# Patient Record
Sex: Female | Born: 1950 | Race: White | Hispanic: No | Marital: Married | Smoking: Never smoker
Health system: Southern US, Community
[De-identification: ages and names within clinical notes are randomized; demographics above are authoritative.]

## PROBLEM LIST (undated history)

## (undated) DIAGNOSIS — H35039 Hypertensive retinopathy, unspecified eye: Secondary | ICD-10-CM

## (undated) DIAGNOSIS — H269 Unspecified cataract: Secondary | ICD-10-CM

## (undated) DIAGNOSIS — Z973 Presence of spectacles and contact lenses: Secondary | ICD-10-CM

## (undated) DIAGNOSIS — E785 Hyperlipidemia, unspecified: Secondary | ICD-10-CM

## (undated) DIAGNOSIS — K219 Gastro-esophageal reflux disease without esophagitis: Secondary | ICD-10-CM

## (undated) DIAGNOSIS — M1990 Unspecified osteoarthritis, unspecified site: Secondary | ICD-10-CM

## (undated) DIAGNOSIS — H3320 Serous retinal detachment, unspecified eye: Secondary | ICD-10-CM

## (undated) DIAGNOSIS — R519 Headache, unspecified: Secondary | ICD-10-CM

## (undated) DIAGNOSIS — I10 Essential (primary) hypertension: Secondary | ICD-10-CM

## (undated) DIAGNOSIS — I639 Cerebral infarction, unspecified: Secondary | ICD-10-CM

## (undated) DIAGNOSIS — T7840XA Allergy, unspecified, initial encounter: Secondary | ICD-10-CM

## (undated) HISTORY — DX: Hyperlipidemia, unspecified: E78.5

## (undated) HISTORY — DX: Unspecified osteoarthritis, unspecified site: M19.90

## (undated) HISTORY — PX: CATARACT EXTRACTION: SUR2

## (undated) HISTORY — DX: Hypertensive retinopathy, unspecified eye: H35.039

## (undated) HISTORY — PX: LIPOMA RESECTION: SHX23

## (undated) HISTORY — PX: JOINT REPLACEMENT: SHX530

## (undated) HISTORY — PX: COLONOSCOPY: SHX174

## (undated) HISTORY — DX: Gastro-esophageal reflux disease without esophagitis: K21.9

## (undated) HISTORY — PX: EYE SURGERY: SHX253

---
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
## 2004-09-04 ENCOUNTER — Ambulatory Visit: Payer: Self-pay | Admitting: Surgery

## 2007-04-20 ENCOUNTER — Emergency Department: Payer: Self-pay | Admitting: Emergency Medicine

## 2007-07-20 ENCOUNTER — Ambulatory Visit: Payer: Self-pay | Admitting: Internal Medicine

## 2008-09-09 HISTORY — PX: REPLACEMENT TOTAL KNEE: SUR1224

## 2009-02-20 ENCOUNTER — Ambulatory Visit: Payer: Self-pay | Admitting: General Practice

## 2009-03-01 ENCOUNTER — Inpatient Hospital Stay: Payer: Self-pay | Admitting: General Practice

## 2011-05-14 ENCOUNTER — Ambulatory Visit (INDEPENDENT_AMBULATORY_CARE_PROVIDER_SITE_OTHER): Payer: BC Managed Care – PPO | Admitting: Internal Medicine

## 2011-05-14 ENCOUNTER — Encounter: Payer: Self-pay | Admitting: Internal Medicine

## 2011-05-14 DIAGNOSIS — E119 Type 2 diabetes mellitus without complications: Secondary | ICD-10-CM | POA: Insufficient documentation

## 2011-05-14 DIAGNOSIS — I10 Essential (primary) hypertension: Secondary | ICD-10-CM

## 2011-05-14 DIAGNOSIS — R32 Unspecified urinary incontinence: Secondary | ICD-10-CM | POA: Insufficient documentation

## 2011-05-14 DIAGNOSIS — Z0000 Encounter for general adult medical examination without abnormal findings: Secondary | ICD-10-CM

## 2011-05-14 DIAGNOSIS — E785 Hyperlipidemia, unspecified: Secondary | ICD-10-CM

## 2011-05-14 NOTE — Patient Instructions (Signed)
Follow up with Dr. Yates Decamp as scheduled in November 2012. Return to clinic here in 1 year.

## 2011-05-14 NOTE — Progress Notes (Signed)
Subjective:    Patient ID: Mariah Cortez, female    DOB: 09-Nov-1950, 60 y.o.   MRN: [id number]  HPI [name] is a 60 year old female who presents for female exam. She reports that her primary care physician, Dr. Yates Decamp, currently manages her diabetes, hypertension, and hyperlipidemia. She presents only for female physical exam. Her only complaint today is chronic urinary incontinence. She questions whether or not she may be a candidate for surgical repair of her bladder. She has not tried medications for urge incontinence. She reports both chronic dribbling of urine and symptoms of urge incontinence. She wears a pad on a daily basis. She denies any dysuria, hematuria, flank pain, fever. She denies any other complaints.  Outpatient Encounter Prescriptions as of 05/14/2011  Medication Sig Dispense Refill  . Ascorbic Acid (VITAMIN C CR) 1500 MG TBCR Take 1 tablet by mouth daily.        [name] aspirin 81 MG tablet Take 81 mg by mouth daily.        . Calcium Carbonate-Vitamin D (CALCIUM 600 + D PO) Take 1 capsule by mouth 2 (two) times daily.        . cetirizine (ZYRTEC) 10 MG tablet Take 10 mg by mouth daily.        . enalapril (VASOTEC) 20 MG tablet Take 20 mg by mouth daily.        . Ferrous Sulfate (IRON) 325 (65 FE) MG TABS Take 1 capsule by mouth daily.        [name] HYDROCHLOROTHIAZIDE PO Take 1 tablet by mouth daily.        [name] lovastatin (MEVACOR) 40 MG tablet Take 40 mg by mouth 2 (two) times daily.        . Misc Natural Products (OSTEO BI-FLEX JOINT SHIELD PO) Take 1 tablet by mouth daily.        . Multiple Vitamin (MULTIVITAMIN) capsule Take 1 capsule by mouth daily.        . Omega-3 Fatty Acids (FISH OIL) 1000 MG CAPS Take 1 capsule by mouth daily.        [name] omeprazole (PRILOSEC) 20 MG capsule Take 20 mg by mouth daily.        . traMADol (ULTRAM) 50 MG tablet Take 50 mg by mouth 2 (two) times daily.          Review of Systems  Constitutional: Negative for fever, chills, appetite change, fatigue  and unexpected weight change.  HENT: Negative for ear pain, congestion, sore throat, trouble swallowing, neck pain, voice change and sinus pressure.   Eyes: Negative for visual disturbance.  Respiratory: Negative for cough, shortness of breath, wheezing and stridor.   Cardiovascular: Negative for chest pain, palpitations and leg swelling.  Gastrointestinal: Negative for nausea, vomiting, abdominal pain, diarrhea, constipation, blood in stool, abdominal distention and anal bleeding.  Genitourinary: Negative for dysuria, urgency, frequency, hematuria, flank pain, decreased urine volume and difficulty urinating.  Musculoskeletal: Negative for myalgias, arthralgias and gait problem.  Skin: Negative for color change, rash and wound.  Neurological: Negative for dizziness and headaches.  Hematological: Negative for adenopathy. Does not bruise/bleed easily.  Psychiatric/Behavioral: Negative for dysphoric mood. The patient is not nervous/anxious.        Objective:   Physical Exam  Constitutional: She is oriented to person, place, and time. She appears well-developed and well-nourished. No distress.  HENT:  Head: Normocephalic and atraumatic.  Right Ear: Hearing, tympanic membrane, external ear and ear canal normal.  Left Ear: Hearing, tympanic membrane,  external ear and ear canal normal.  Nose: Nose normal.  Mouth/Throat: Oropharynx is clear and moist. No oropharyngeal exudate.  Eyes: Conjunctivae and EOM are normal. Pupils are equal, round, and reactive to light. Right eye exhibits no discharge. Left eye exhibits no discharge. No scleral icterus.  Neck: Normal range of motion. Neck supple. Carotid bruit is not present. No tracheal deviation present. No thyromegaly present.  Cardiovascular: Normal rate, regular rhythm, normal heart sounds and intact distal pulses.  Exam reveals no gallop and no friction rub.   No murmur heard. Pulmonary/Chest: Effort normal and breath sounds normal. No respiratory  distress. She has no wheezes. She has no rales. She exhibits no tenderness.  Abdominal: Soft. Bowel sounds are normal. She exhibits no distension and no mass. There is no hepatosplenomegaly. There is no tenderness. There is no rebound and no guarding.  Genitourinary: Rectum normal and uterus normal. No breast swelling, tenderness, discharge or bleeding. Pelvic exam was performed with patient prone. There is no rash, tenderness or lesion on the right labia. There is no rash, tenderness or lesion on the left labia. Cervix exhibits no motion tenderness, no discharge and no friability. Right adnexum displays no mass, no tenderness and no fullness. Left adnexum displays no mass, no tenderness and no fullness. There is erythema around the vagina.  Musculoskeletal: Normal range of motion. She exhibits no edema and no tenderness.  Lymphadenopathy:    She has no cervical adenopathy.  Neurological: She is alert and oriented to person, place, and time. No cranial nerve deficit. She exhibits normal muscle tone. Coordination normal.  Skin: Skin is warm and dry. No rash noted. She is not diaphoretic. No erythema. No pallor.  Psychiatric: She has a normal mood and affect. Her behavior is normal. Judgment and thought content normal.          Assessment & Plan:  [name] is a 60 year old female who presents for a general physical exam. Her exam today was normal with the exception of atrophic vaginitis noted. Her breast exam was normal. She reports that her primary care physician Dr. Yates Decamp has set her up for yearly mammogram. We will request records on this. We discussed that if her Pap is normal today she will need next Pap smear in 3 years. We will plan to see her back in our clinic for annual physical exam in one year. Per her preference, she will continue to follow with Dr. Yates Decamp for management of her diabetes, hypertension, hyperlipidemia. In regards to her chronic urinary incontinence, I recommended  referral to urogynecology for evaluation and/or a trial of medication such as to BS. She would like to defer referral or  trying medication at this time. We also discussed that she is due for both TDAP and Zostavax vaccines. We don't have the TDAP vaccine today, and she may return to clinic next week for this. She will go to her pharmacy for the Zostavax vaccine.

## 2011-05-15 ENCOUNTER — Other Ambulatory Visit (HOSPITAL_COMMUNITY)
Admission: RE | Admit: 2011-05-15 | Discharge: 2011-05-15 | Disposition: A | Discharge status: Home / Self Care | Payer: BC Managed Care – PPO | Source: Ambulatory Visit | Attending: Internal Medicine | Admitting: Internal Medicine

## 2011-05-15 DIAGNOSIS — Z01419 Encounter for gynecological examination (general) (routine) without abnormal findings: Secondary | ICD-10-CM | POA: Insufficient documentation

## 2011-05-15 NOTE — Progress Notes (Signed)
Addended by: Melody Comas L on: 05/15/2011 11:01 AM   Modules accepted: Orders

## 2011-05-30 ENCOUNTER — Telehealth: Payer: Self-pay | Admitting: Internal Medicine

## 2011-09-10 HISTORY — PX: REPLACEMENT TOTAL KNEE: SUR1224

## 2011-12-24 ENCOUNTER — Ambulatory Visit: Payer: Self-pay | Admitting: General Practice

## 2011-12-24 LAB — MRSA PCR SCREENING

## 2011-12-24 LAB — SEDIMENTATION RATE: Erythrocyte Sed Rate: 6 mm/hr (ref 0–30)

## 2011-12-24 LAB — PROTIME-INR: INR: 0.9

## 2011-12-24 LAB — URINALYSIS, COMPLETE
Bacteria: NONE SEEN
Bilirubin,UR: NEGATIVE
Blood: NEGATIVE
Ketone: NEGATIVE
Nitrite: NEGATIVE
Ph: 6 (ref 4.5–8.0)
Protein: NEGATIVE
RBC,UR: <1 /HPF (ref 0–5)
Specific Gravity: 1.01 (ref 1.003–1.030)
Squamous Epithelial: <1
WBC UR: <1 /HPF (ref 0–5)

## 2011-12-24 LAB — CBC
HCT: 38 % (ref 35.0–47.0)
MCV: 91 fL (ref 80–100)
RBC: 4.16 10*6/uL (ref 3.80–5.20)

## 2012-01-06 ENCOUNTER — Inpatient Hospital Stay: Payer: Self-pay | Admitting: General Practice

## 2012-01-07 LAB — PLATELET COUNT: Platelet: 133 10*3/uL — ABNORMAL LOW (ref 150–440)

## 2012-01-07 LAB — HEMOGLOBIN: HGB: 11.4 g/dL — ABNORMAL LOW (ref 12.0–16.0)

## 2012-01-07 LAB — BASIC METABOLIC PANEL
Anion Gap: 11 (ref 7–16)
BUN: 9 mg/dL (ref 7–18)
Calcium, Total: 8.1 mg/dL — ABNORMAL LOW (ref 8.5–10.1)
EGFR (African American): >60
EGFR (Non-African Amer.): >60
Sodium: 138 mmol/L (ref 136–145)

## 2012-01-08 LAB — BASIC METABOLIC PANEL
Anion Gap: 7 (ref 7–16)
BUN: 7 mg/dL (ref 7–18)
Chloride: 103 mmol/L (ref 98–107)
Co2: 31 mmol/L (ref 21–32)
Creatinine: 0.57 mg/dL — ABNORMAL LOW (ref 0.60–1.30)
EGFR (African American): >60
EGFR (Non-African Amer.): >60
Osmolality: 283 (ref 275–301)
Potassium: 3.7 mmol/L (ref 3.5–5.1)
Sodium: 141 mmol/L (ref 136–145)

## 2012-01-08 LAB — PLATELET COUNT: Platelet: 129 10*3/uL — ABNORMAL LOW (ref 150–440)

## 2012-01-10 ENCOUNTER — Encounter: Payer: Self-pay | Admitting: Internal Medicine

## 2012-04-14 NOTE — Telephone Encounter (Signed)
DONE

## 2012-07-28 ENCOUNTER — Other Ambulatory Visit (HOSPITAL_COMMUNITY)
Admission: RE | Admit: 2012-07-28 | Discharge: 2012-07-28 | Disposition: A | Discharge status: Home / Self Care | Payer: BC Managed Care – PPO | Source: Ambulatory Visit | Attending: Internal Medicine | Admitting: Internal Medicine

## 2012-07-28 ENCOUNTER — Encounter: Payer: Self-pay | Admitting: Internal Medicine

## 2012-07-28 ENCOUNTER — Ambulatory Visit (INDEPENDENT_AMBULATORY_CARE_PROVIDER_SITE_OTHER): Payer: BC Managed Care – PPO | Admitting: Internal Medicine

## 2012-07-28 VITALS — BP 142/86 | HR 86 | Temp 98.8°F | Ht <= 58 in | Wt 237.0 lb

## 2012-07-28 DIAGNOSIS — E119 Type 2 diabetes mellitus without complications: Secondary | ICD-10-CM

## 2012-07-28 DIAGNOSIS — B373 Candidiasis of vulva and vagina: Secondary | ICD-10-CM

## 2012-07-28 DIAGNOSIS — Z0000 Encounter for general adult medical examination without abnormal findings: Secondary | ICD-10-CM | POA: Insufficient documentation

## 2012-07-28 DIAGNOSIS — Z1151 Encounter for screening for human papillomavirus (HPV): Secondary | ICD-10-CM | POA: Insufficient documentation

## 2012-07-28 DIAGNOSIS — Z01419 Encounter for gynecological examination (general) (routine) without abnormal findings: Secondary | ICD-10-CM | POA: Insufficient documentation

## 2012-07-28 DIAGNOSIS — I10 Essential (primary) hypertension: Secondary | ICD-10-CM

## 2012-07-28 MED ORDER — NYSTATIN 100000 UNIT/GM EX POWD: Freq: Two times a day (BID) | CUTANEOUS | Status: DC

## 2012-07-28 NOTE — Assessment & Plan Note (Signed)
Blood pressure slightly elevated today. Patient reports good control of blood pressure at home. We'll continue current medications.

## 2012-07-28 NOTE — Assessment & Plan Note (Signed)
General medical exam including pelvic exam normal today except as noted. Pap is pending. Vaginal atrophy noted on exam. We discussed that if this Pap is normal and HPV negative she likely does not need repeat Pap. We discussed the Pap does not provide screening for ovarian cancer, only cervical cancer. She will continue to follow with Dr. Yates Decamp at [facility] for routine care. Follow up here in 1 year for GYN exam.

## 2012-07-28 NOTE — Progress Notes (Signed)
Subjective:    Patient ID: Mariah Cortez, female    DOB: 1950-10-22, 61 y.o.   MRN: [id number]  HPI  61 year old female with history of obesity, hypertension, hyperlipidemia, diabetes presents for gynecological exam. She follows with Dr. Yates Decamp at [facility] for her routine care.  She reports that she's been doing well. She reports that blood sugars have been well controlled with last hemoglobin A1c near 7%. She reports full compliance with her medications. Last Pap smear performed in 2012 had inadequate cells for evaluation. She denies any vaginal discharge, pain, bleeding or other concerns today.  Outpatient Encounter Prescriptions as of 07/28/2012  Medication Sig Dispense Refill  . Ascorbic Acid (VITAMIN C CR) 1500 MG TBCR Take 1 tablet by mouth daily.        [name] aspirin 81 MG tablet Take 81 mg by mouth daily.        . Calcium Carbonate-Vitamin D (CALCIUM 600 + D PO) Take 1 capsule by mouth 2 (two) times daily.        . cetirizine (ZYRTEC) 10 MG tablet Take 10 mg by mouth daily.        . enalapril (VASOTEC) 20 MG tablet Take 20 mg by mouth daily.        . Ferrous Sulfate (IRON) 325 (65 FE) MG TABS Take 1 capsule by mouth daily.        [name] HYDROCHLOROTHIAZIDE PO Take 1 tablet by mouth daily.        [name] lovastatin (MEVACOR) 40 MG tablet Take 40 mg by mouth 2 (two) times daily.        . metFORMIN (GLUCOPHAGE) 500 MG tablet Take 500 mg by mouth 1 day or 1 dose.      . Misc Natural Products (OSTEO BI-FLEX JOINT SHIELD PO) Take 1 tablet by mouth daily.        . Multiple Vitamin (MULTIVITAMIN) capsule Take 1 capsule by mouth daily.        . naproxen (NAPROSYN) 500 MG tablet Take 500 mg by mouth 2 (two) times daily with a meal.      . Omega-3 Fatty Acids (FISH OIL) 1000 MG CAPS Take 1 capsule by mouth daily.        [name] omeprazole (PRILOSEC) 20 MG capsule Take 20 mg by mouth daily.        . traMADol (ULTRAM) 50 MG tablet Take 50 mg by mouth 2 (two) times daily.        [name] nystatin (MYCOSTATIN) powder  Apply topically 2 (two) times daily.  15 g  0   BP 142/86  Pulse 86  Temp 98.8 F (37.1 C)  Ht 4\' 10"  (1.473 m)  Wt 237 lb (107.502 kg)  BMI 49.53 kg/m2  Review of Systems  Constitutional: Negative for fever, chills, appetite change, fatigue and unexpected weight change.  HENT: Negative for ear pain, congestion, sore throat, trouble swallowing, neck pain, voice change and sinus pressure.   Eyes: Negative for visual disturbance.  Respiratory: Negative for cough, shortness of breath, wheezing and stridor.   Cardiovascular: Negative for chest pain, palpitations and leg swelling.  Gastrointestinal: Negative for nausea, vomiting, abdominal pain, diarrhea, constipation, blood in stool, abdominal distention and anal bleeding.  Genitourinary: Negative for dysuria, urgency, hematuria, flank pain, vaginal bleeding, vaginal discharge, genital sores, vaginal pain and pelvic pain.  Musculoskeletal: Negative for myalgias, arthralgias and gait problem.  Skin: Negative for color change and rash.  Neurological: Negative for dizziness and headaches.  Hematological: Negative for adenopathy.  Does not bruise/bleed easily.  Psychiatric/Behavioral: Negative for suicidal ideas, sleep disturbance and dysphoric mood. The patient is not nervous/anxious.        Objective:   Physical Exam  Constitutional: She is oriented to person, place, and time. She appears well-developed and well-nourished. No distress.  HENT:  Head: Normocephalic and atraumatic.  Right Ear: External ear normal.  Left Ear: External ear normal.  Nose: Nose normal.  Mouth/Throat: Oropharynx is clear and moist. No oropharyngeal exudate.  Eyes: Conjunctivae normal are normal. Pupils are equal, round, and reactive to light. Right eye exhibits no discharge. Left eye exhibits no discharge. No scleral icterus.  Neck: Normal range of motion. Neck supple. No tracheal deviation present. No thyromegaly present.  Cardiovascular: Normal rate, regular  rhythm, normal heart sounds and intact distal pulses.  Exam reveals no gallop and no friction rub.   No murmur heard. Pulmonary/Chest: Effort normal and breath sounds normal. No respiratory distress. She has no wheezes. She has no rales. She exhibits no tenderness.  Abdominal: Soft. Bowel sounds are normal. She exhibits no distension and no mass. There is no tenderness. There is no rebound and no guarding.  Genitourinary: Uterus normal. Cervix exhibits friability. Cervix exhibits no motion tenderness and no discharge. Right adnexum displays no mass, no tenderness and no fullness. Left adnexum displays no mass, no tenderness and no fullness. There is erythema (consistent with atrophy) around the vagina.  Musculoskeletal: Normal range of motion. She exhibits no edema and no tenderness.  Lymphadenopathy:    She has no cervical adenopathy.  Neurological: She is alert and oriented to person, place, and time. No cranial nerve deficit. She exhibits normal muscle tone. Coordination normal.  Skin: Skin is warm and dry. No rash noted. She is not diaphoretic. There is erythema (pannus folds). No pallor.  Psychiatric: She has a normal mood and affect. Her behavior is normal. Judgment and thought content normal.          Assessment & Plan:

## 2012-07-28 NOTE — Assessment & Plan Note (Signed)
Patient reports good control of blood sugars with last hemoglobin A1c near 7. She will continue to follow with her primary care physician, Dr. Yates Decamp at [facility] q3 months.

## 2012-07-28 NOTE — Assessment & Plan Note (Addendum)
Erythematous rash consistent with candidiasis noted in pannus folds and throughout groin. Will treat with topical nystatin powder. Followup as needed.

## 2012-07-29 ENCOUNTER — Encounter: Payer: Self-pay | Admitting: *Deleted

## 2012-07-29 ENCOUNTER — Telehealth: Payer: Self-pay | Admitting: Internal Medicine

## 2012-07-29 NOTE — Telephone Encounter (Signed)
Error

## 2013-01-15 ENCOUNTER — Ambulatory Visit: Payer: Self-pay | Admitting: Unknown Physician Specialty

## 2013-04-06 ENCOUNTER — Telehealth: Payer: Self-pay | Admitting: Internal Medicine

## 2013-04-06 NOTE — Telephone Encounter (Signed)
This pt has previously followed with [place] for yearly exams and with Yates Decamp for Diabetes Management. Because of new insurance requirements, she will need to choose one primary care provider. If Dr. Yates Decamp is managing her diabetes, and she will continue with him, then she should follow with him and we should remove me as her PCP.

## 2013-04-07 NOTE — Telephone Encounter (Signed)
Left message on home answer machine for pt to call office

## 2013-04-08 ENCOUNTER — Telehealth: Payer: Self-pay | Admitting: Adult Health

## 2013-04-08 NOTE — Telephone Encounter (Signed)
Opened in erorr

## 2013-04-09 NOTE — Telephone Encounter (Signed)
Spoke with pt she is going to see dr Yates Decamp next week and she will discuss this with him and call back to let us know if she is going with dr Jonny Ruiz walker or with you.  She know she would have to come in every 3 months to follow up for her diabetes

## 2013-04-21 NOTE — Telephone Encounter (Signed)
Patient called and left a message on my voicemail stating she was going to continue to see Dr. Yates Decamp as her PCP. She only came here because she thought he did not pap smears but he does so she will keep seeing him at her doctor.

## 2013-04-22 ENCOUNTER — Telehealth: Payer: Self-pay | Admitting: Internal Medicine

## 2013-04-22 NOTE — Telephone Encounter (Signed)
Pt called back stating she spoke with dr Jonny Ruiz walker, he was telling her there were 2 types of cpx.  One was routine and one was female physcial.  She wanted to confirm this.  Also pt wanted to know if she know went with dr Yates Decamp and she isn't happy can she come back to you.  Also she wanted to know at 49 how often does she need to get a pap

## 2013-04-22 NOTE — Telephone Encounter (Signed)
Fwd to Dr. Ronna Polio

## 2013-04-22 NOTE — Telephone Encounter (Signed)
It is true that there are general physical exams and well-woman exams.   If her PAPs have been normal in the past, she likely does not need another PAP smear. Guidelines say to stop PAPs at age 64, unless ongoing issues.

## 2013-04-23 NOTE — Telephone Encounter (Signed)
Left message to call back  

## 2013-04-26 NOTE — Telephone Encounter (Signed)
FYI to Dr. Walker 

## 2013-04-26 NOTE — Telephone Encounter (Signed)
OK. Please take me off as PCP

## 2013-04-26 NOTE — Telephone Encounter (Signed)
Patient returned our call and she is going to go with Dr. Yates Decamp since he is closer to her home.  She said that there is nothing personal in her leaving our practice she just knows that he is closer to her home.

## 2014-01-28 DIAGNOSIS — Z96659 Presence of unspecified artificial knee joint: Secondary | ICD-10-CM | POA: Insufficient documentation

## 2014-01-28 DIAGNOSIS — I10 Essential (primary) hypertension: Secondary | ICD-10-CM | POA: Insufficient documentation

## 2014-11-08 DIAGNOSIS — M19041 Primary osteoarthritis, right hand: Secondary | ICD-10-CM | POA: Insufficient documentation

## 2014-12-23 DIAGNOSIS — M19011 Primary osteoarthritis, right shoulder: Secondary | ICD-10-CM | POA: Insufficient documentation

## 2014-12-23 DIAGNOSIS — E1129 Type 2 diabetes mellitus with other diabetic kidney complication: Secondary | ICD-10-CM | POA: Insufficient documentation

## 2014-12-23 DIAGNOSIS — E782 Mixed hyperlipidemia: Secondary | ICD-10-CM | POA: Insufficient documentation

## 2015-01-01 NOTE — Op Note (Signed)
PATIENT NAME:  Mariah Cortez, [name] MR#:  [id number] DATE OF BIRTH:  1951/03/02  DATE OF PROCEDURE:  01/06/2012  PROCEDURE: Left femoral nerve block.   SURGEON: Lurleen Soltero P. 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 Francesco SorJames Hooten.   DESCRIPTION OF PROCEDURE: The risks and benefits of the procedure and spinal block and general anesthesia were discussed with the patient preoperatively in same day surgery. 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 She agreed and consent was signed. She was brought to the PAC-U preoperatively. The usual monitors were applied. She was placed on nasal cannula oxygen. Her EKG monitor did show PVCs in singlets which were not observed on her preoperative 12-lead EKG. The patient did state that she has a history of irregular beats like that. It was decided to proceed with the block. She was lightly sedated with a total of 3 mg of Versed intravenously. She was still awake, talkative and in good verbal communication with us but much more comfortable. She had a Betadine prep of her left upper thigh and left groin area x3 and a sterile technique was used. A Stimuplex monitor was used with 0.7 mA of output. The 22-gauge Stimuplex needle was advanced approximately 1 inch lateral to the left femoral pulsation with good left thigh movement on the first pass. There was no heme or paresthesias. She had a total of 30 mL of 0.25% bupivacaine with 1:400,000 of epinephrine injected incrementally. She tolerated the block without problem or complication. I am hopeful that the block will help her significantly with postoperative pain for the duration of the block.   ____________________________ Kirby CriglerScott P. Rubye OaksPalmer, MD spp:cms D: 01/06/2012 09:58:22 ET T: 01/06/2012 10:19:24  ET JOB#: [id number]  cc: Lorin PicketScott P. Rubye OaksPalmer, MD, <Dictator> Heriberto AntiguaSCOTT Nachman Sundt MD ELECTRONICALLY SIGNED 01/06/2012 10:38

## 2015-01-01 NOTE — Op Note (Signed)
PATIENT NAME:  Mariah Cortez, Mariah Cortez MR#:  [id number] DATE OF BIRTH:  07/26/1951  DATE OF PROCEDURE:  01/06/2012  PREOPERATIVE DIAGNOSIS: Degenerative arthrosis of the left knee.   POSTOPERATIVE DIAGNOSIS: Degenerative arthrosis of the left knee.   PROCEDURE PERFORMED: Left total knee arthroplasty using computer-assisted navigation.   SURGEON: Illene LabradorJames P. Angie FavaHooten Jr., MD  ASSISTANT: Van ClinesJon Wolfe, PA-C (required to maintain retraction throughout the procedure)   ANESTHESIA: Femoral nerve block and spinal.   ESTIMATED BLOOD LOSS: 200 mL.   FLUIDS REPLACED: 1600 mL of crystalloid.   TOURNIQUET TIME: 81 minutes.   DRAINS: Two medium drains to reinfusion system.   SOFT TISSUE RELEASES: Anterior cruciate ligament, posterior cruciate ligament, deep medial collateral ligament, patellofemoral ligament.   IMPLANTS UTILIZED: DePuy PFC Sigma size 2 posterior stabilized femoral component (cemented), size 2 MBT tibial component (cemented), 32 mm three peg oval dome patella (cemented), and a 12.5 mm stabilized rotating platform polyethylene insert.   INDICATIONS FOR SURGERY: The patient is a 64 year old female who has been seen for complaints of progressive left knee pain. X-rays demonstrated severe degenerative changes in tricompartmental fashion with varus deformity. After discussion of the risks and benefits of surgical intervention, the patient expressed her understanding of the risks and benefits and agreed with plans for surgical intervention.   PROCEDURE IN DETAIL: Patient was brought into the Operating Room and, after adequate femoral nerve block and spinal anesthesia was achieved, a tourniquet was placed on patient's upper left thigh. Patient's left knee and leg were cleaned and prepped with alcohol and DuraPrep, draped in the usual sterile fashion. A "timeout" was performed as per usual protocol. The left lower extremity was exsanguinated using Esmarch, and the tourniquet was inflated to 300 mmHg. Anterior  longitudinal incision was made followed by a standard mid vastus approach. A moderate effusion was evacuated. The deep fibers of the medial collateral ligament were elevated in subperiosteal fashion off the medial flare of the tibia so as to maintain a continuous soft tissue sleeve. The patella was subluxed laterally and the patellofemoral ligament was incised. Inspection of the knee demonstrated severe degenerative changes in tricompartmental fashion with full thickness loss of articular cartilage. Prominent osteophytes were debrided using a rongeur. Anterior and posterior cruciate ligaments were excised. Two 4.0 mm Schanz pins were inserted into the femur and into the tibia for attachment of the ray of spheres used for computer-assisted navigation. Hip center was identified using circumduction technique. Distal landmarks were mapped using computer. Distal femur and proximal tibia were mapped using computer. Distal femoral cutting guide was positioned using computer-assisted navigation so as to achieve 5 degrees distal valgus cut. Cut was performed and verified using the computer. Distal femur was sized and it was felt that a size 2 femoral component was appropriate. A size 2 cutting guide was positioned using computer-assisted navigation and the anterior cut was performed and verified using computer. This was followed by completion of the posterior and chamfer cuts. Femoral cutting guide for central box was then positioned and central box cut was performed.   Attention was then directed to the proximal tibia. Medial and lateral menisci were excised. The extramedullary tibial cutting guide was positioned using computer-assisted navigation so as to achieve 0 degree varus valgus alignment and 0 degree posterior slope. Cut was performed and verified using the computer. Proximal tibia was sized and it was felt that a size 2 tibial tray was appropriate. Tibial and femoral trials were inserted followed by insertion of  first a 10  and subsequently a 12.5 mm polyethylene trial. This allowed for excellent mediolateral soft tissue balancing both in full extension and in 90 degrees of flexion. Finally, patella was cut and prepared so as to accommodate a 32 mm three peg oval dome patella. Patellar trial was placed and the knee was placed through a range of motion. Excellent patellar tracking appreciated.   Femoral trial was removed. Central post hole for the tibial component was reamed followed by insertion of a keel punch. Tibial trials were then removed. Cut surfaces of bone were irrigated with copious amounts of normal saline with antibiotic solution using pulsatile lavage and then suctioned dry. Polymethyl methacrylate cement with gentamicin was prepared in the usual fashion using a vacuum mixer. Cement was applied to the cut surface of the proximal tibia as well as along the undersurface of a size 2 MBT tibial component. Tibial component was positioned and impacted into place. Excess cement was removed using freer elevators. Cement was then applied to the cut surfaces of the femur as well as along the posterior flanges of a size 2 posterior stabilized femoral component. Femoral component was positioned and impacted into place. Excess cement was removed using freer elevators. A 12.5 mm polyethylene trial was inserted and the knee was brought into full extension with steady axial compression applied. Finally, cement was applied to the backside of a 32 mm three peg oval dome patella and the patella component was positioned and patellar clamp applied. Excess cement was removed using freer elevators.   After adequate curing of the cement, tourniquet was deflated after total tourniquet time of 81 minutes. Hemostasis was achieved using electrocautery. The knee was irrigated with copious amounts of normal saline with antibiotic solution using pulsatile lavage and then suctioned dry. Knee was inspected for any residual cement debris. 30  mL of 0.25% Marcaine with epinephrine was injected along the posterior capsule. A 12.5 mm stabilized rotating platform polyethylene insert was inserted and the knee was placed through a range of motion. Excellent mediolateral soft tissue balancing was appreciated both in full extension and in 90 degrees of flexion. Excellent patellar tracking was appreciated. Two medium drains were placed in the wound bed and brought out through a separate stab incision to be attached to reinfusion system. The medial parapatellar portion of the incision was reapproximated using interrupted sutures of #1 Vicryl. Subcutaneous tissue was approximated in layers using first #0 Vicryl followed by 2-0 Vicryl. Skin was closed with skin staples. Sterile dressing was applied.   Patient tolerated procedure well. She was transported to the recovery room in stable condition.   ____________________________ Illene Labrador. Angie Fava., MD jph:cms D: 01/06/2012 21:25:02 ET T: 01/07/2012 08:20:57 ET JOB#: [id number]  cc: Illene Labrador. Angie Fava., MD, <Dictator> Illene Labrador Angie Fava MD ELECTRONICALLY SIGNED 01/10/2012 18:07

## 2015-01-01 NOTE — Discharge Summary (Signed)
PATIENT NAME:  Mariah Cortez, Mariah Cortez MR#:  [id number] DATE OF BIRTH:  10-14-1950  DATE OF ADMISSION:  01/06/2012 DATE OF DISCHARGE:  01/10/2012  ADMITTING DIAGNOSIS: Degenerative arthrosis of the left knee.   DISCHARGE DIAGNOSIS: Degenerative arthrosis of the left knee.   HISTORY: The patient is a 64 year old who has been followed at [facility] for progression of left knee pain. The patient had reported a several year history of intermittent left knee pain. Approximately seven months ago Mariah Cortez was noted to have significant increase in her left knee pain. The patient had not seen any significant improvement in her condition despite the use of naproxen, cortisone injections, ice, and activity modification. The patient localized most of the pain along the medial aspect as well as the anterior aspect of the knee. Her pain was noted be worse with weight-bearing activities such as standing and walking and stair ambulation. Mariah Cortez had also reported some swelling as well as near giving way of the knee. Mariah Cortez denied any gross locking of the knee. The pain had progressed to the point that it was significantly interfering with her activities of daily living. X-rays taken in the clinic showed narrowing of the medial cartilage space with associated varus alignment. Osteophyte formation as well as subchondral sclerosis was noted. After discussion of the risks and benefits of surgical intervention, the patient expressed her understanding of the risks and benefits and agreed with plans for surgical intervention.   PROCEDURE: Left total knee arthroplasty using computer-assisted navigation.   ANESTHESIA: Femoral nerve block with spinal.   SOFT TISSUE RELEASE: Anterior cruciate ligament, posterior cruciate ligament, deep medial collateral ligaments, as well as the patellofemoral ligament.   IMPLANTS UTILIZED: DePuy PFC Sigma size 2 posterior stabilized femoral component (cemented), size 2 MBT tibial component (cemented), 32 mm  three pegged oval dome patella (cemented), and a 12.5 mm stabilized rotating platform polyethylene insert.   HOSPITAL COURSE: The patient tolerated the procedure very well. Mariah Cortez had no complications. Mariah Cortez was then taken to the PAC-U where Mariah Cortez was stabilized and then transferred to the orthopedic floor. Mariah Cortez began receiving anticoagulation therapy of Lovenox 30 mg subcutaneous every 12 hours per anesthesia protocol. Mariah Cortez was fitted with TED stockings bilaterally. These were allowed to be removed one hour per eight hour shift. The left one was applied on day two after removal of the Hemovac and dressing change. Mariah Cortez was also fitted with the AV-I compression foot pumps bilaterally set at 80 mmHg. The patient's calves have been nontender and free of any evidence of any deep venous thromboses. Negative Homans sign. Her heels were elevated off the bed using rolled towels. Mariah Cortez has voiced no complaints with the heel tenderness.   Mariah Cortez has denied any chest pain or shortness of breath. Vital signs have been stable. Mariah Cortez has been afebrile. Hemodynamically Mariah Cortez was stable and no transfusions were given other than the Autovac transfusion given the first six hours postoperatively.   Physical therapy was initiated on day one for gait training and transfers. Upon being discharged, Mariah Cortez was ambulating greater than 200 feet. Mariah Cortez was able go up and down four sets of steps. Mariah Cortez was not able to do straight leg raises on her own and was very apprehensive about going home because of the remaining weakness. Mariah Cortez was able to dorsiflex and plantarflex the foot. Occupational therapy was also initiated on day one for activities of daily living and assisted devices.   The patient's IV, Foley, and Hemovac were all discontinued on day two along  with a dressing change. The wound was free of any drainage or any signs of infection. Polar Care was reapplied to the surgical leg maintaining a temperature of 40 to 50 degrees Fahrenheit.   DISPOSITION:  The patient is being discharged to a skilled nursing facility in improved stable condition.   DISCHARGE INSTRUCTIONS:  1. Mariah Cortez may weight bear as tolerated.  2. Mariah Cortez is to continue using the knee immobilizer until Mariah Cortez is able to do 10 straight leg raises.  3. Continue with TED stockings. These are allowed to be removed one hour per eight hour shift.  4. Incentive spirometer every 1 hour while awake. Encourage cough and deep breathing every two hours while awake.  5. Polar Care to the surgical leg maintaining a temperature of 40 to 50 degrees Fahrenheit.  6. Physical therapy for gait training and transfers. Occupational therapy for activities of daily living and assistive devices.  7. Mariah Cortez is placed on a regular diet.  8. Mariah Cortez has a follow-up appointment on 01/21/2012 with Van ClinesJon Wolfe, PA and on 02/18/2012 with Dr. Francesco SorJames Hooten.   DRUG ALLERGIES: Morphine and tape.   DISCHARGE MEDICATIONS:  1. Tylenol ES 500 to 1000 mg every four hours p.r.n.  2. Roxicodone 5 to 10 mg every four hours p.r.n. 3. Tramadol 50 to 100 mg every four hours p.r.n.  4. Vasotec 20 mg daily.  5. Hydrochlorothiazide 25 mg daily. 6. Celebrex 200 mg twice a day. 7. Mevacor 80 mg at bedtime.  8. Prilosec 20 mg every 6 a.m.  9. Multivitamin capsule one capsule daily.  10. Omega-3 fatty acids 1 gram capsule daily.  11. Ferrous sulfate 325 mg daily with meal.  12. Ascorbic acid 500 mg daily. 13. Zyrtec 10 mg daily. 14. Metformin 500 mg daily with meal.  15. Insulin sliding scale Novolin R injections.  16. Dulcolax suppositories 10 mg rectally daily p.r.n. 17. Milk of Magnesia 30 mL twice a day p.r.n.  18. Enema soapsuds if no results with milk of magnesia or Dulcolax p.r.n.  19. Mylanta DS 30 mL every 6 hours p.r.n.  20. Senokot-S 1 tablet twice a day.  21. Lovenox 30 mg subcutaneous every 12 hours for 14 days then discontinue and begin taking one 81 mg enteric-coated aspirin per day.   PAST MEDICAL HISTORY:   1. Hypertension.  2. Hyperlipidemia.  3. Seasonal allergies.  4. Diabetes. 5. Acid reflux. 6. Anemia.       7. Migraine headaches. 8. Obesity. ____________________________ Van ClinesJon Wolfe, PA jrw:slb D: 01/10/2012 12:29:58 ET T: 01/10/2012 13:05:29 ET JOB#: [id number]  cc: Van ClinesJon Wolfe, PA, <Dictator> JON WOLFE PA ELECTRONICALLY SIGNED 01/12/2012 19:43

## 2015-02-22 DIAGNOSIS — M8949 Other hypertrophic osteoarthropathy, multiple sites: Secondary | ICD-10-CM | POA: Insufficient documentation

## 2015-02-22 DIAGNOSIS — M159 Polyosteoarthritis, unspecified: Secondary | ICD-10-CM | POA: Insufficient documentation

## 2015-02-22 DIAGNOSIS — M150 Primary generalized (osteo)arthritis: Secondary | ICD-10-CM | POA: Insufficient documentation

## 2015-08-24 ENCOUNTER — Other Ambulatory Visit: Payer: Self-pay | Admitting: Internal Medicine

## 2015-08-24 DIAGNOSIS — Z1239 Encounter for other screening for malignant neoplasm of breast: Secondary | ICD-10-CM

## 2015-09-07 ENCOUNTER — Ambulatory Visit: Payer: BC Managed Care – PPO

## 2015-11-23 DIAGNOSIS — Z6841 Body Mass Index (BMI) 40.0 and over, adult: Secondary | ICD-10-CM | POA: Insufficient documentation

## 2016-02-28 DIAGNOSIS — E7800 Pure hypercholesterolemia, unspecified: Secondary | ICD-10-CM | POA: Insufficient documentation

## 2016-09-10 ENCOUNTER — Other Ambulatory Visit: Payer: Self-pay | Admitting: Internal Medicine

## 2016-09-10 DIAGNOSIS — Z1239 Encounter for other screening for malignant neoplasm of breast: Secondary | ICD-10-CM

## 2016-09-11 DIAGNOSIS — K219 Gastro-esophageal reflux disease without esophagitis: Secondary | ICD-10-CM | POA: Insufficient documentation

## 2017-07-05 DIAGNOSIS — R002 Palpitations: Secondary | ICD-10-CM | POA: Insufficient documentation

## 2017-07-05 DIAGNOSIS — R42 Dizziness and giddiness: Secondary | ICD-10-CM | POA: Insufficient documentation

## 2019-05-06 ENCOUNTER — Other Ambulatory Visit: Payer: Self-pay | Admitting: Physician Assistant

## 2019-05-06 DIAGNOSIS — R519 Headache, unspecified: Secondary | ICD-10-CM

## 2019-05-19 ENCOUNTER — Ambulatory Visit
Admission: RE | Admit: 2019-05-19 | Discharge: 2019-05-19 | Disposition: A | Discharge status: Home / Self Care | Payer: Medicare Other | Source: Ambulatory Visit | Attending: Physician Assistant | Admitting: Physician Assistant

## 2019-05-19 ENCOUNTER — Other Ambulatory Visit: Payer: Self-pay

## 2019-05-19 DIAGNOSIS — R519 Headache, unspecified: Secondary | ICD-10-CM

## 2019-05-19 DIAGNOSIS — R51 Headache: Secondary | ICD-10-CM | POA: Insufficient documentation

## 2019-05-19 MED ORDER — GADOBUTROL 1 MMOL/ML IV SOLN
10.0000 mL | Freq: Once | INTRAVENOUS | Status: AC | PRN
  Administered 2019-05-19: 10 mL via INTRAVENOUS

## 2019-11-22 ENCOUNTER — Encounter (INDEPENDENT_AMBULATORY_CARE_PROVIDER_SITE_OTHER): Payer: Self-pay | Admitting: Ophthalmology

## 2019-11-22 ENCOUNTER — Ambulatory Visit (INDEPENDENT_AMBULATORY_CARE_PROVIDER_SITE_OTHER): Payer: Medicare PPO | Admitting: Ophthalmology

## 2019-11-22 ENCOUNTER — Other Ambulatory Visit (HOSPITAL_COMMUNITY)
Admission: RE | Admit: 2019-11-22 | Discharge: 2019-11-22 | Disposition: A | Discharge status: Home / Self Care | Payer: Medicare PPO | Source: Ambulatory Visit | Attending: Ophthalmology | Admitting: Ophthalmology

## 2019-11-22 DIAGNOSIS — H35033 Hypertensive retinopathy, bilateral: Secondary | ICD-10-CM

## 2019-11-22 DIAGNOSIS — H3581 Retinal edema: Secondary | ICD-10-CM | POA: Diagnosis not present

## 2019-11-22 DIAGNOSIS — Z20822 Contact with and (suspected) exposure to covid-19: Secondary | ICD-10-CM | POA: Diagnosis not present

## 2019-11-22 DIAGNOSIS — H3321 Serous retinal detachment, right eye: Secondary | ICD-10-CM

## 2019-11-22 DIAGNOSIS — E119 Type 2 diabetes mellitus without complications: Secondary | ICD-10-CM | POA: Diagnosis not present

## 2019-11-22 DIAGNOSIS — Z6841 Body Mass Index (BMI) 40.0 and over, adult: Secondary | ICD-10-CM | POA: Diagnosis not present

## 2019-11-22 DIAGNOSIS — M1990 Unspecified osteoarthritis, unspecified site: Secondary | ICD-10-CM | POA: Diagnosis not present

## 2019-11-22 DIAGNOSIS — Z8261 Family history of arthritis: Secondary | ICD-10-CM | POA: Diagnosis not present

## 2019-11-22 DIAGNOSIS — I10 Essential (primary) hypertension: Secondary | ICD-10-CM

## 2019-11-22 DIAGNOSIS — H25813 Combined forms of age-related cataract, bilateral: Secondary | ICD-10-CM

## 2019-11-22 DIAGNOSIS — Z8249 Family history of ischemic heart disease and other diseases of the circulatory system: Secondary | ICD-10-CM | POA: Diagnosis not present

## 2019-11-22 DIAGNOSIS — Z833 Family history of diabetes mellitus: Secondary | ICD-10-CM | POA: Diagnosis not present

## 2019-11-22 DIAGNOSIS — Z885 Allergy status to narcotic agent status: Secondary | ICD-10-CM | POA: Diagnosis not present

## 2019-11-22 DIAGNOSIS — Z96653 Presence of artificial knee joint, bilateral: Secondary | ICD-10-CM | POA: Diagnosis not present

## 2019-11-22 LAB — SARS CORONAVIRUS 2 (TAT 6-24 HRS): SARS Coronavirus 2: NEGATIVE

## 2019-11-22 NOTE — H&P (Signed)
Mariah Cortez is an 69 y.o. female.    Chief Complaint: retinal detachment, right eye  HPI: Pt presents with decreased vision OD of unclear duration. On dilated exam, noted to have a chronic- appearing inferior retinal detachment. After a discussion of the findings, prognosis, and risk, benefits and alternatives to surgery, the patient elected to proceed with surgery for retinal detachment repair, OD, under general anesthesia.  SBP + 25g PPV w/ endolaser and gas OD.  Past Medical History:  Diagnosis Date  . Arthritis   . Diabetes mellitus   . GERD (gastroesophageal reflux disease)   . Hyperlipidemia     Past Surgical History:  Procedure Laterality Date  . LIPOMA RESECTION     Dr. [name]  . REPLACEMENT TOTAL KNEE  2010   Right, Dr. [name]  . REPLACEMENT TOTAL KNEE  2013   left    Family History  Problem Relation Age of Onset  . Stroke Mother   . Hypertension Mother   . Diabetes Sister   . Arthritis Maternal Aunt    Social History:  reports that she has never smoked. She has never used smokeless tobacco. She reports current alcohol use. No history on file for drug.  Allergies:  Allergies  Allergen Reactions  . Morphine And Related Itching    No medications prior to admission.    Review of systems otherwise negative  There were no vitals taken for this visit.  Physical exam: Mental status: oriented x3. Eyes: See eye exam associated with this date of surgery Ears, Nose, Throat: within normal limits Neck: Within Normal limits General: within normal limits Chest: Within normal limits Breast: deferred Heart: Within normal limits Abdomen: Within normal limits GU: deferred Extremities: within normal limits Skin: within normal limits  Assessment/Plan 1. Retinal detachment, RIGHT EYE - chronic, mac/fovea off, inferior detachment  Plan: To [facility] for SBP + 25g PPV w/ endolaser and gas OD under general anesthesia - case scheduled for Thursday, 3.18.21, 1130 am,  [facility] OR 08   Gardiner Sleeper, M.D., Ph.D. Vitreoretinal Surgeon [facility]

## 2019-11-22 NOTE — Progress Notes (Signed)
[facility] Note  11/22/2019     CHIEF COMPLAINT Patient presents for Retina Evaluation   HISTORY OF PRESENT ILLNESS: Mariah Cortez is a 69 y.o. female who presents to the clinic today for:   HPI    Retina Evaluation    In right eye.  Duration of 3 days.  Associated Symptoms Negative for Flashes, Distortion, Pain, Photophobia, Trauma, Jaw Claudication, Fever, Fatigue, Weight Loss, Shoulder/Hip pain, Scalp Tenderness, Blind Spot, Redness, Glare and Floaters.  Context:  distance vision, mid-range vision and near vision.  Treatments tried include no treatments.  I, the attending physician,  performed the HPI with the patient and updated documentation appropriately.          Comments    Patient here for retina evaluation for RD OD mac off. referred by Dr Ellin Mayhew. Patient states vision is terrible. Has cataracts OU. Thought was cataracts, but vision was getting worse. Not sure how long had rd.  Saw Dr and dr saw the RD. No eye pain. Eye watery and itchy.       Last edited by Debbrah Alar, COT on 11/22/2019  8:55 AM. (History)    pt states she saw Dr. Ellin Mayhew on Friday, she states she has known she has cataracts since last August, she states she went to see Dr. Ellin Mayhew bc her vision has gotten worse, she endorses being diabetic and having high blood pressure, she states she is allergic to morphine and latex, she states she has a floater in her left eye that is brown and round, she denies hx of eye sx, she states she has had a stroke, she states about a month or so ago she fell, but she doesn't think she hit her head  Referring physician: Anell Barr, OD [place],  [place] [address]  HISTORICAL INFORMATION:   Selected notes from the MEDICAL RECORD NUMBER Referred by Dr. Orion Modest for concern of mac off RD  LEE:  Ocular Hx- PMH-   CURRENT MEDICATIONS: No current outpatient medications on file. (Ophthalmic Drugs)   No current  facility-administered medications for this visit. (Ophthalmic Drugs)   Current Outpatient Medications (Other)  Medication Sig  . Ascorbic Acid (VITAMIN C CR) 1500 MG TBCR Take 1 tablet by mouth daily.    [name] aspirin 81 MG tablet Take 81 mg by mouth daily.    . Calcium Carbonate-Vitamin D (CALCIUM 600 + D PO) Take 1 capsule by mouth 2 (two) times daily.    . cetirizine (ZYRTEC) 10 MG tablet Take 10 mg by mouth daily.    . enalapril (VASOTEC) 20 MG tablet Take 20 mg by mouth daily.    . Ferrous Sulfate (IRON) 325 (65 FE) MG TABS Take 1 capsule by mouth daily.    [name] HYDROCHLOROTHIAZIDE PO Take 1 tablet by mouth daily.    [name] lovastatin (MEVACOR) 40 MG tablet Take 40 mg by mouth 2 (two) times daily.    . metFORMIN (GLUCOPHAGE) 500 MG tablet Take 500 mg by mouth 1 day or 1 dose.  . Misc Natural Products (OSTEO BI-FLEX JOINT SHIELD PO) Take 1 tablet by mouth daily.    . Multiple Vitamin (MULTIVITAMIN) capsule Take 1 capsule by mouth daily.    . naproxen (NAPROSYN) 500 MG tablet Take 500 mg by mouth 2 (two) times daily with a meal.  . nystatin (MYCOSTATIN) powder Apply topically 2 (two) times daily.  . Omega-3 Fatty Acids (FISH OIL) 1000 MG CAPS  Take 1 capsule by mouth daily.    [name] omeprazole (PRILOSEC) 20 MG capsule Take 20 mg by mouth daily.    . traMADol (ULTRAM) 50 MG tablet Take 50 mg by mouth 2 (two) times daily.     No current facility-administered medications for this visit. (Other)      REVIEW OF SYSTEMS: ROS    Positive for: Eyes   Last edited by Theodore Demark, COA on 11/22/2019  8:40 AM. (History)       ALLERGIES Allergies  Allergen Reactions  . Morphine And Related Itching    PAST MEDICAL HISTORY Past Medical History:  Diagnosis Date  . Arthritis   . Diabetes mellitus   . GERD (gastroesophageal reflux disease)   . Hyperlipidemia    Past Surgical History:  Procedure Laterality Date  . LIPOMA RESECTION     Dr. [name]  . REPLACEMENT TOTAL KNEE  2010   Right, Dr.  [name]  . REPLACEMENT TOTAL KNEE  2013   left    FAMILY HISTORY Family History  Problem Relation Age of Onset  . Stroke Mother   . Hypertension Mother   . Diabetes Sister   . Arthritis Maternal Aunt     SOCIAL HISTORY Social History   Tobacco Use  . Smoking status: Never Smoker  . Smokeless tobacco: Never Used  Substance Use Topics  . Alcohol use: Yes    Comment: occassionaly  . Drug use: Not on file         OPHTHALMIC EXAM:  Base Eye Exam    Visual Acuity (Snellen - Linear)      Right Left   Dist cc 20/40 20/20 -1   Dist ph cc NI    Correction: Glasses       Tonometry (Tonopen, 8:31 AM)      Right Left   Pressure 16 16       Pupils      Dark Light Shape React APD   Right 3 2 Round Brisk None   Left 3 2 Round Brisk None       Visual Fields (Counting fingers)      Left Right    Full Full       Extraocular Movement      Right Left    Full, Ortho Full, Ortho       Neuro/Psych    Oriented x3: Yes   Mood/Affect: Normal       Dilation    Both eyes: 1.0% Mydriacyl, 2.5% Phenylephrine @ 8:31 AM        Slit Lamp and Fundus Exam    Slit Lamp Exam      Right Left   Lids/Lashes Dermatochalasis - upper lid Dermatochalasis - upper lid   Conjunctiva/Sclera White and quiet White and quiet   Cornea Trace Punctate epithelial erosions 1-2+Punctate epithelial erosions   Anterior Chamber Deep and quiet Deep and quiet   Iris Round and dilated, No NVI Round and dilated, No NVI   Lens 2-3+ Nuclear sclerosis, 2-3+ Cortical cataract 2-3+ Nuclear sclerosis, 2-3+ Cortical cataract   Vitreous Vitreous syneresis Vitreous syneresis, Posterior vitreous detachment       Fundus Exam      Right Left   Disc Pink and Sharp Pink and Sharp   C/D Ratio 0.2 0.2   Macula SRF inferiorly, demarcation line just above fovea Flat, Blunted foveal reflex, Retinal pigment epithelial mottling, No heme or edema   Vessels Vascular attenuation, mild Tortuousity Vascular attenuation,  mild Tortuousity  Periphery Inferior detachment from 0200-1030, Subretinal band IT midzone, ?small tear at 1030 Attached, 2 focal areas of pigment clumping temporally, No RT/RD on 360 scleral depression         Refraction    Wearing Rx      Sphere Cylinder Axis Add   Right -0.50 +1.75 161 +2.25   Left -1.50 +1.00 002 +2.25   Age: 24 mos   Type: PAL       Manifest Refraction      Sphere Cylinder Axis Dist VA   Right -0.75 +1.50 173 20/40   Left -1.75 +1.50 013 20/20          IMAGING AND PROCEDURES  Imaging and Procedures for _0 @  OCT, Retina - OU - Both Eyes       Right Eye Quality was good. Central Foveal Thickness: 476. Progression has no prior data. Findings include no SRF, abnormal foveal contour, no IRF (IT detachment with foveal involvement).   Left Eye Quality was good. Central Foveal Thickness: 250. Progression has no prior data. Findings include normal foveal contour, no IRF, no SRF.   Notes *Images captured and stored on drive  Diagnosis / Impression:  OD: abnormal foveal contour, +SRF, no IRF -- inferotemporal retinal detachment with foveal involvement OS: NFP, no IRF/SRF  Clinical management:  See below  Abbreviations: NFP - Normal foveal profile. CME - cystoid macular edema. PED - pigment epithelial detachment. IRF - intraretinal fluid. SRF - subretinal fluid. EZ - ellipsoid zone. ERM - epiretinal membrane. ORA - outer retinal atrophy. ORT - outer retinal tubulation. SRHM - subretinal hyper-reflective material        Color Fundus Photography Optos - OU - Both Eyes       Right Eye Progression has no prior data. Disc findings include normal observations. Macula : detached. Vessels : attenuated, tortuous vessels. Periphery : detachment (Subretinal band/fibrosis temporal periphery).   Left Eye Progression has no prior data. Disc findings include normal observations. Macula : normal observations, retinal pigment epithelium abnormalities. Vessels :  tortuous vessels, attenuated. Periphery : normal observations, RPE abnormality.   Notes **Images stored on drive**  Impression: OD: inferior retinal detachment, extending superior to 1030 and 0200; +subretinal band IT midzone                   ASSESSMENT/PLAN:    ICD-10-CM   1. Right retinal detachment  H33.21 Color Fundus Photography Optos - OU - Both Eyes  2. Retinal edema  H35.81 OCT, Retina - OU - Both Eyes  3. Essential hypertension  I10   4. Hypertensive retinopathy of both eyes  H35.033   5. Combined forms of age-related cataract of both eyes  H25.813     1,2. 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   - Potential treatment options including delimiting laser, pneumatic retinopexy, scleral buckle, and vitrectomy, cryotherapy and laser, and the use of air, gas, and oil discussed with patient.  - The risks of blindness, loss of vision, infection, hemorrhage, cataract progression or lens displacement were discussed with patient.  - recommend SBP + 25g PPV/EL/Gas OD under general anesthesia  - pt wishes to proceed with surgery  - RBA of procedure discussed, questions answered  -  informed consent obtained and signed  - case scheduled for Thursday, November 25, 2019 11:30AM [facility] OR 8  - pt will need medical clearance from PCP and pre-op COVID test  - f/u Friday, March 19 for POV1  3,4. Hypertensive retinopathy OU  - discussed importance of tight BP control  - monitor  5. Mixed form age related cataracts OU  - The symptoms of cataract, surgical options, and treatments and risks were discussed with patient.  - discussed diagnosis and progression  - specifically discussed likelihood of progression of  cataract OD following RD repair   Ophthalmic Meds Ordered this visit:  No orders of the defined types were placed in this encounter.      Return in about 4 days (around 11/26/2019).  There are no Patient Instructions on file for this visit.   Explained the diagnoses, plan, and follow up with the patient and they expressed understanding.  Patient expressed understanding of the importance of proper follow up care.   This document serves as a record of services personally performed by Gardiner Sleeper, MD, PhD. It was created on their behalf by Ernest Mallick, OA, an ophthalmic assistant. The creation of this record is the provider's dictation and/or activities during the visit.    Electronically signed by: Ernest Mallick, OA 03.15.2021 1:02 PM   Gardiner Sleeper, M.D., Ph.D. Diseases & Surgery of the Retina and Vitreous [facility]  I have reviewed the above documentation for accuracy and completeness, and I agree with the above. Gardiner Sleeper, M.D., Ph.D. 11/22/19 1:02 PM   Abbreviations: M myopia (nearsighted); A astigmatism; H hyperopia (farsighted); P presbyopia; Mrx spectacle prescription;  CTL contact lenses; OD right eye; OS left eye; OU both eyes  XT exotropia; ET esotropia; PEK punctate epithelial keratitis; PEE punctate epithelial erosions; DES dry eye syndrome; MGD meibomian gland dysfunction; ATs artificial tears; PFAT's preservative free artificial tears; [place] nuclear sclerotic cataract; PSC posterior subcapsular cataract; ERM epi-retinal membrane; PVD posterior vitreous detachment; RD retinal detachment; DM diabetes mellitus; DR diabetic retinopathy; NPDR non-proliferative diabetic retinopathy; PDR proliferative diabetic retinopathy; CSME clinically significant macular edema; DME diabetic macular edema; dbh dot blot hemorrhages; CWS cotton wool spot; POAG primary open angle glaucoma; C/D cup-to-disc ratio; HVF humphrey visual field; GVF goldmann visual field;  OCT optical coherence tomography; IOP intraocular pressure; BRVO Branch retinal vein occlusion; CRVO central retinal vein occlusion; CRAO central retinal artery occlusion; BRAO branch retinal artery occlusion; RT retinal tear; SB scleral buckle; PPV pars plana vitrectomy; VH Vitreous hemorrhage; PRP panretinal laser photocoagulation; IVK intravitreal kenalog; VMT vitreomacular traction; MH Macular hole;  NVD neovascularization of the disc; NVE neovascularization elsewhere; AREDS age related eye disease study; ARMD age related macular degeneration; POAG primary open angle glaucoma; EBMD epithelial/anterior basement membrane dystrophy; ACIOL anterior chamber intraocular lens; IOL intraocular lens; PCIOL posterior chamber intraocular lens; Phaco/IOL phacoemulsification with intraocular lens placement; [place] photorefractive keratectomy; LASIK laser assisted in situ keratomileusis; HTN hypertension; DM diabetes mellitus; COPD chronic obstructive pulmonary disease

## 2019-11-24 ENCOUNTER — Other Ambulatory Visit: Payer: Self-pay

## 2019-11-24 ENCOUNTER — Encounter (HOSPITAL_COMMUNITY): Payer: Self-pay | Admitting: Ophthalmology

## 2019-11-24 NOTE — Progress Notes (Signed)
Pt denies SOB, chest pain, and being under the care of a cardiologist. Pt stated that PCP is Dr. Maurine Minister. Pt denies having a stress test and cardiac cath. Pt denies recent labs. Pt made aware to stop taking Aspirin (unless otherwise advised by surgeon), vitamins, fish oil, Osteo Bi Flex and herbal medications. Do not take any NSAIDs ie: Mobic, Ibuprofen, Advil, Naproxen (Aleve), Motrin, BC and Goody Powder. Pt made aware to hold Glimepiride at HS and on DOS, hold Metformin and Actos on DOS. Pt made aware to check CBG every 2 hours prior to arrival to hospital on DOS. Pt made aware to treat a CBG < 70 with 4 ounces of apple or cranberry juice, wait 15 minutes after intervention to recheck CBG, if CBG remains < 70, call Short Stay unit to speak with a nurse. Pt reminded to quarantine. Pt verbalized understanding of all pre-op instructions.

## 2019-11-25 ENCOUNTER — Encounter (HOSPITAL_COMMUNITY): Payer: Self-pay | Admitting: Ophthalmology

## 2019-11-25 ENCOUNTER — Other Ambulatory Visit: Payer: Self-pay

## 2019-11-25 ENCOUNTER — Ambulatory Visit (HOSPITAL_COMMUNITY): Payer: Medicare PPO | Admitting: Anesthesiology

## 2019-11-25 ENCOUNTER — Encounter (HOSPITAL_COMMUNITY)
Admission: RE | Disposition: A | Discharge status: Home / Self Care | Payer: Self-pay | Source: Home / Self Care | Attending: Ophthalmology

## 2019-11-25 ENCOUNTER — Ambulatory Visit (HOSPITAL_COMMUNITY)
Admission: RE | Admit: 2019-11-25 | Discharge: 2019-11-25 | Disposition: A | Discharge status: Home / Self Care | Payer: Medicare PPO | Attending: Ophthalmology | Admitting: Ophthalmology

## 2019-11-25 DIAGNOSIS — Z6841 Body Mass Index (BMI) 40.0 and over, adult: Secondary | ICD-10-CM | POA: Insufficient documentation

## 2019-11-25 DIAGNOSIS — I10 Essential (primary) hypertension: Secondary | ICD-10-CM | POA: Insufficient documentation

## 2019-11-25 DIAGNOSIS — Z20822 Contact with and (suspected) exposure to covid-19: Secondary | ICD-10-CM | POA: Insufficient documentation

## 2019-11-25 DIAGNOSIS — H338 Other retinal detachments: Secondary | ICD-10-CM

## 2019-11-25 DIAGNOSIS — M1990 Unspecified osteoarthritis, unspecified site: Secondary | ICD-10-CM | POA: Diagnosis not present

## 2019-11-25 DIAGNOSIS — E119 Type 2 diabetes mellitus without complications: Secondary | ICD-10-CM | POA: Diagnosis not present

## 2019-11-25 DIAGNOSIS — Z8249 Family history of ischemic heart disease and other diseases of the circulatory system: Secondary | ICD-10-CM | POA: Insufficient documentation

## 2019-11-25 DIAGNOSIS — Z8261 Family history of arthritis: Secondary | ICD-10-CM | POA: Insufficient documentation

## 2019-11-25 DIAGNOSIS — Z96653 Presence of artificial knee joint, bilateral: Secondary | ICD-10-CM | POA: Insufficient documentation

## 2019-11-25 DIAGNOSIS — H3321 Serous retinal detachment, right eye: Secondary | ICD-10-CM | POA: Insufficient documentation

## 2019-11-25 DIAGNOSIS — Z833 Family history of diabetes mellitus: Secondary | ICD-10-CM | POA: Insufficient documentation

## 2019-11-25 DIAGNOSIS — Z885 Allergy status to narcotic agent status: Secondary | ICD-10-CM | POA: Insufficient documentation

## 2019-11-25 HISTORY — PX: VITRECTOMY 25 GAUGE WITH SCLERAL BUCKLE: SHX6183

## 2019-11-25 HISTORY — DX: Headache, unspecified: R51.9

## 2019-11-25 HISTORY — DX: Cerebral infarction, unspecified: I63.9

## 2019-11-25 HISTORY — PX: EYE SURGERY: SHX253

## 2019-11-25 HISTORY — PX: RETINAL DETACHMENT SURGERY: SHX105

## 2019-11-25 HISTORY — DX: Essential (primary) hypertension: I10

## 2019-11-25 HISTORY — DX: Serous retinal detachment, unspecified eye: H33.20

## 2019-11-25 HISTORY — PX: GAS/FLUID EXCHANGE: SHX5334

## 2019-11-25 HISTORY — DX: Allergy, unspecified, initial encounter: T78.40XA

## 2019-11-25 HISTORY — DX: Presence of spectacles and contact lenses: Z97.3

## 2019-11-25 HISTORY — PX: PHOTOCOAGULATION: SHX5303

## 2019-11-25 LAB — BASIC METABOLIC PANEL
Anion gap: 11 (ref 5–15)
BUN: 22 mg/dL (ref 8–23)
CO2: 26 mmol/L (ref 22–32)
Calcium: 10.1 mg/dL (ref 8.9–10.3)
Chloride: 103 mmol/L (ref 98–111)
Creatinine, Ser: 0.92 mg/dL (ref 0.44–1.00)
GFR calc Af Amer: >60 mL/min (ref >60)
GFR calc non Af Amer: >60 mL/min (ref >60)
Glucose, Bld: 155 mg/dL — ABNORMAL HIGH (ref 70–99)
Potassium: 4.1 mmol/L (ref 3.5–5.1)
Sodium: 140 mmol/L (ref 135–145)

## 2019-11-25 LAB — GLUCOSE, CAPILLARY
Glucose-Capillary: 149 mg/dL — ABNORMAL HIGH (ref 70–99)
Glucose-Capillary: 187 mg/dL — ABNORMAL HIGH (ref 70–99)

## 2019-11-25 LAB — CBC
HCT: 38.1 % (ref 36.0–46.0)
Hemoglobin: 12.2 g/dL (ref 12.0–15.0)
MCH: 30.9 pg (ref 26.0–34.0)
MCHC: 32 g/dL (ref 30.0–36.0)
MCV: 96.5 fL (ref 80.0–100.0)
Platelets: 180 10*3/uL (ref 150–400)
RBC: 3.95 MIL/uL (ref 3.87–5.11)
RDW: 12.3 % (ref 11.5–15.5)
WBC: 6.6 10*3/uL (ref 4.0–10.5)
nRBC: 0 % (ref 0.0–0.2)

## 2019-11-25 SURGERY — VITRECTOMY, USING 25-GAUGE INSTRUMENTS, WITH SCLERAL BUCKLING
Anesthesia: General | Site: Eye | Laterality: Right

## 2019-11-25 MED ORDER — SUGAMMADEX SODIUM 200 MG/2ML IV SOLN
INTRAVENOUS | Status: DC | PRN
  Administered 2019-11-25: 200 mg via INTRAVENOUS

## 2019-11-25 MED ORDER — SODIUM HYALURONATE 10 MG/ML IO SOLN
INTRAOCULAR | Status: AC
  Filled 2019-11-25: qty 0.85

## 2019-11-25 MED ORDER — DORZOLAMIDE HCL-TIMOLOL MAL 2-0.5 % OP SOLN
OPHTHALMIC | Status: AC
  Filled 2019-11-25: qty 10

## 2019-11-25 MED ORDER — LIDOCAINE HCL 2 % IJ SOLN
INTRAMUSCULAR | Status: DC | PRN
  Administered 2019-11-25: 10 mL via RETROBULBAR

## 2019-11-25 MED ORDER — ONDANSETRON HCL 4 MG/2ML IJ SOLN
INTRAMUSCULAR | Status: DC | PRN
  Administered 2019-11-25: 4 mg via INTRAVENOUS

## 2019-11-25 MED ORDER — DEXAMETHASONE SODIUM PHOSPHATE 10 MG/ML IJ SOLN
INTRAMUSCULAR | Status: AC
  Filled 2019-11-25: qty 1

## 2019-11-25 MED ORDER — PROPOFOL 10 MG/ML IV BOLUS
INTRAVENOUS | Status: DC | PRN
  Administered 2019-11-25: 100 mg via INTRAVENOUS

## 2019-11-25 MED ORDER — BRIMONIDINE TARTRATE 0.2 % OP SOLN
OPHTHALMIC | Status: DC | PRN
  Administered 2019-11-25: 1 [drp] via OPHTHALMIC

## 2019-11-25 MED ORDER — FENTANYL CITRATE (PF) 250 MCG/5ML IJ SOLN
INTRAMUSCULAR | Status: AC
  Filled 2019-11-25: qty 5

## 2019-11-25 MED ORDER — HYDROCODONE-ACETAMINOPHEN 5-325 MG PO TABS: 1.0000 | ORAL_TABLET | ORAL | 0 refills | Status: AC | PRN

## 2019-11-25 MED ORDER — PHENYLEPHRINE HCL 10 % OP SOLN
1.0000 [drp] | OPHTHALMIC | Status: AC | PRN
  Administered 2019-11-25 (×3): 1 [drp] via OPHTHALMIC
  Filled 2019-11-25: qty 5

## 2019-11-25 MED ORDER — LIDOCAINE 2% (20 MG/ML) 5 ML SYRINGE
INTRAMUSCULAR | Status: AC
  Filled 2019-11-25: qty 5

## 2019-11-25 MED ORDER — ATROPINE SULFATE 1 % OP SOLN
OPHTHALMIC | Status: AC
  Filled 2019-11-25: qty 5

## 2019-11-25 MED ORDER — STERILE WATER FOR INJECTION IJ SOLN
INTRAMUSCULAR | Status: DC | PRN
  Administered 2019-11-25: 20 mL

## 2019-11-25 MED ORDER — CEFTAZIDIME 1 G IJ SOLR
INTRAMUSCULAR | Status: AC
  Filled 2019-11-25: qty 1

## 2019-11-25 MED ORDER — POLYMYXIN B SULFATE 500000 UNITS IJ SOLR
INTRAMUSCULAR | Status: AC
  Filled 2019-11-25: qty 500000

## 2019-11-25 MED ORDER — FENTANYL CITRATE (PF) 100 MCG/2ML IJ SOLN: 25.0000 ug | INTRAMUSCULAR | Status: DC | PRN

## 2019-11-25 MED ORDER — ROCURONIUM BROMIDE 10 MG/ML (PF) SYRINGE
PREFILLED_SYRINGE | INTRAVENOUS | Status: AC
  Filled 2019-11-25: qty 10

## 2019-11-25 MED ORDER — STERILE WATER FOR INJECTION IJ SOLN
INTRAMUSCULAR | Status: AC
  Filled 2019-11-25: qty 10

## 2019-11-25 MED ORDER — LIDOCAINE 2% (20 MG/ML) 5 ML SYRINGE
INTRAMUSCULAR | Status: DC | PRN
  Administered 2019-11-25: 60 mg via INTRAVENOUS

## 2019-11-25 MED ORDER — BSS PLUS IO SOLN
INTRAOCULAR | Status: AC
  Filled 2019-11-25: qty 500

## 2019-11-25 MED ORDER — ROCURONIUM BROMIDE 10 MG/ML (PF) SYRINGE
PREFILLED_SYRINGE | INTRAVENOUS | Status: DC | PRN
  Administered 2019-11-25: 60 mg via INTRAVENOUS
  Administered 2019-11-25 (×2): 20 mg via INTRAVENOUS

## 2019-11-25 MED ORDER — BACITRACIN-POLYMYXIN B 500-10000 UNIT/GM OP OINT
TOPICAL_OINTMENT | OPHTHALMIC | Status: AC
  Filled 2019-11-25: qty 3.5

## 2019-11-25 MED ORDER — SODIUM CHLORIDE 0.9 % IV SOLN: INTRAVENOUS | Status: DC

## 2019-11-25 MED ORDER — BSS IO SOLN
INTRAOCULAR | Status: AC
  Filled 2019-11-25: qty 15

## 2019-11-25 MED ORDER — BRIMONIDINE TARTRATE 0.2 % OP SOLN
OPHTHALMIC | Status: AC
  Filled 2019-11-25: qty 5

## 2019-11-25 MED ORDER — EPINEPHRINE PF 1 MG/ML IJ SOLN
INTRAMUSCULAR | Status: AC
  Filled 2019-11-25: qty 1

## 2019-11-25 MED ORDER — ATROPINE SULFATE 1 % OP SOLN
1.0000 [drp] | OPHTHALMIC | Status: AC | PRN
  Administered 2019-11-25 (×3): 1 [drp] via OPHTHALMIC
  Filled 2019-11-25: qty 5

## 2019-11-25 MED ORDER — EPINEPHRINE PF 1 MG/ML IJ SOLN
INTRAOCULAR | Status: DC | PRN
  Administered 2019-11-25: 500 mL

## 2019-11-25 MED ORDER — NA CHONDROIT SULF-NA HYALURON 40-30 MG/ML IO SOLN
INTRAOCULAR | Status: AC
  Filled 2019-11-25: qty 1

## 2019-11-25 MED ORDER — GATIFLOXACIN 0.5 % OP SOLN
OPHTHALMIC | Status: AC
  Filled 2019-11-25: qty 2.5

## 2019-11-25 MED ORDER — PHENYLEPHRINE HCL-NACL 10-0.9 MG/250ML-% IV SOLN
INTRAVENOUS | Status: DC | PRN
  Administered 2019-11-25: 25 ug/min via INTRAVENOUS

## 2019-11-25 MED ORDER — TRIAMCINOLONE ACETONIDE 40 MG/ML IJ SUSP
INTRAMUSCULAR | Status: AC
  Filled 2019-11-25: qty 5

## 2019-11-25 MED ORDER — PREDNISOLONE ACETATE 1 % OP SUSP
OPHTHALMIC | Status: AC
  Filled 2019-11-25: qty 5

## 2019-11-25 MED ORDER — TRIAMCINOLONE ACETONIDE 40 MG/ML IJ SUSP
INTRAMUSCULAR | Status: DC | PRN
  Administered 2019-11-25: 40 mg

## 2019-11-25 MED ORDER — CARBACHOL 0.01 % IO SOLN
INTRAOCULAR | Status: AC
  Filled 2019-11-25: qty 1.5

## 2019-11-25 MED ORDER — ONDANSETRON HCL 4 MG/2ML IJ SOLN
INTRAMUSCULAR | Status: AC
  Filled 2019-11-25: qty 2

## 2019-11-25 MED ORDER — BUPIVACAINE HCL (PF) 0.75 % IJ SOLN
INTRAMUSCULAR | Status: AC
  Filled 2019-11-25: qty 10

## 2019-11-25 MED ORDER — FENTANYL CITRATE (PF) 100 MCG/2ML IJ SOLN
INTRAMUSCULAR | Status: DC | PRN
  Administered 2019-11-25: 100 ug via INTRAVENOUS

## 2019-11-25 MED ORDER — ACETAMINOPHEN 500 MG PO TABS
1000.0000 mg | ORAL_TABLET | Freq: Once | ORAL | Status: AC
  Administered 2019-11-25: 1000 mg via ORAL
  Filled 2019-11-25: qty 2

## 2019-11-25 MED ORDER — ATROPINE SULFATE 1 % OP SOLN
OPHTHALMIC | Status: DC | PRN
  Administered 2019-11-25: 1 [drp] via OPHTHALMIC

## 2019-11-25 MED ORDER — TROPICAMIDE 1 % OP SOLN
1.0000 [drp] | OPHTHALMIC | Status: AC | PRN
  Administered 2019-11-25 (×3): 1 [drp] via OPHTHALMIC
  Filled 2019-11-25: qty 15

## 2019-11-25 MED ORDER — PROPARACAINE HCL 0.5 % OP SOLN
1.0000 [drp] | OPHTHALMIC | Status: AC | PRN
  Administered 2019-11-25 (×3): 1 [drp] via OPHTHALMIC
  Filled 2019-11-25: qty 15

## 2019-11-25 MED ORDER — BACITRACIN-POLYMYXIN B 500-10000 UNIT/GM OP OINT
TOPICAL_OINTMENT | OPHTHALMIC | Status: DC | PRN
  Administered 2019-11-25: 1 via OPHTHALMIC

## 2019-11-25 MED ORDER — DEXAMETHASONE SODIUM PHOSPHATE 10 MG/ML IJ SOLN
INTRAMUSCULAR | Status: DC | PRN
  Administered 2019-11-25: 5 mg via INTRAVENOUS

## 2019-11-25 MED ORDER — LIDOCAINE HCL 1 % IJ SOLN
INTRAMUSCULAR | Status: AC
  Filled 2019-11-25: qty 20

## 2019-11-25 MED ORDER — SODIUM CHLORIDE (PF) 0.9 % IJ SOLN
INTRAMUSCULAR | Status: AC
  Filled 2019-11-25: qty 10

## 2019-11-25 MED ORDER — DORZOLAMIDE HCL-TIMOLOL MAL 2-0.5 % OP SOLN
OPHTHALMIC | Status: DC | PRN
  Administered 2019-11-25: 1 [drp] via OPHTHALMIC

## 2019-11-25 MED ORDER — GATIFLOXACIN 0.5 % OP SOLN
OPHTHALMIC | Status: DC | PRN
  Administered 2019-11-25: 1 [drp] via OPHTHALMIC

## 2019-11-25 MED ORDER — BSS PLUS IO SOLN
INTRAOCULAR | Status: DC | PRN
  Administered 2019-11-25: 1 via INTRAOCULAR

## 2019-11-25 MED ORDER — NA CHONDROIT SULF-NA HYALURON 40-30 MG/ML IO SOLN
INTRAOCULAR | Status: DC | PRN
  Administered 2019-11-25: 0.5 mL via INTRAOCULAR

## 2019-11-25 MED ORDER — HYPROMELLOSE (GONIOSCOPIC) 2.5 % OP SOLN
OPHTHALMIC | Status: AC
  Filled 2019-11-25: qty 15

## 2019-11-25 SURGICAL SUPPLY — 67 items
APPLICATOR COTTON TIP 6 STRL (MISCELLANEOUS) ×4 IMPLANT
APPLICATOR COTTON TIP 6IN STRL (MISCELLANEOUS) ×12
BAND SCLERAL BUCKLING TYPE 41 (Ophthalmic Related) ×3 IMPLANT
BAND WRIST GAS GREEN (MISCELLANEOUS) IMPLANT
BETADINE 5% OPHTHALMIC (OPHTHALMIC) ×2 IMPLANT
BNDG EYE OVAL (GAUZE/BANDAGES/DRESSINGS) ×6 IMPLANT
CANNULA FLEX TIP 25G (CANNULA) ×3 IMPLANT
CANNULA VLV SOFT TIP 25GA (OPHTHALMIC) ×3 IMPLANT
CATH FOLEY LATEX FREE 14FR (CATHETERS) ×3
CATH FOLEY LF 14FR (CATHETERS) ×1 IMPLANT
COVER SURGICAL LIGHT HANDLE (MISCELLANEOUS) ×3 IMPLANT
COVER WAND RF STERILE (DRAPES) ×3 IMPLANT
DRAPE MICROSCOPE LEICA 46X105 (MISCELLANEOUS) ×3 IMPLANT
ERASER HMR WETFIELD 23G BP (MISCELLANEOUS) IMPLANT
FILTER BLUE MILLIPORE (MISCELLANEOUS) ×3 IMPLANT
FILTER STRAW FLUID ASPIR (MISCELLANEOUS) IMPLANT
GAS AUTO FILL CONSTEL (OPHTHALMIC) ×3
GAS AUTO FILL CONSTELLATION (OPHTHALMIC) ×1 IMPLANT
GAS WRIST BAND GREEN (MISCELLANEOUS)
GLOVE SURG SS PI 6.5 STRL IVOR (GLOVE) ×9 IMPLANT
GLOVE SURG SS PI 7.0 STRL IVOR (GLOVE) ×3 IMPLANT
GLOVE SURG SS PI 7.5 STRL IVOR (GLOVE) ×6 IMPLANT
GOWN STRL REUS W/ TWL LRG LVL3 (GOWN DISPOSABLE) ×2 IMPLANT
GOWN STRL REUS W/ TWL XL LVL3 (GOWN DISPOSABLE) ×1 IMPLANT
GOWN STRL REUS W/TWL LRG LVL3 (GOWN DISPOSABLE) ×6
GOWN STRL REUS W/TWL XL LVL3 (GOWN DISPOSABLE) ×3
KIT BASIN OR (CUSTOM PROCEDURE TRAY) ×3 IMPLANT
KIT PERFLUORON PROCEDURE 5ML (MISCELLANEOUS) IMPLANT
KNIFE CRESCENT 1.75 EDGEAHEAD (BLADE) IMPLANT
KNIFE GRIESHABER SHARP 2.5MM (MISCELLANEOUS) ×3 IMPLANT
LENS BIOM SUPER VIEW SET DISP (MISCELLANEOUS) IMPLANT
LENS VITRECTOMY FLAT OCLR DISP (MISCELLANEOUS) IMPLANT
MICROPICK 25G (MISCELLANEOUS)
NEEDLE 18GX1X1/2 (RX/OR ONLY) (NEEDLE) ×3 IMPLANT
NEEDLE 25GX 5/8IN NON SAFETY (NEEDLE) ×12 IMPLANT
NEEDLE HYPO 30X.5 LL (NEEDLE) ×6 IMPLANT
NS IRRIG 1000ML POUR BTL (IV SOLUTION) ×3 IMPLANT
OPHTHALMIC BETADINE 5% (OPHTHALMIC) ×6
PACK VITRECTOMY CUSTOM (CUSTOM PROCEDURE TRAY) ×3 IMPLANT
PAD ARMBOARD 7.5X6 YLW CONV (MISCELLANEOUS) ×6 IMPLANT
PAK PIK VITRECTOMY CVS 25GA (OPHTHALMIC) ×3 IMPLANT
PIC ILLUMINATED 25G (OPHTHALMIC)
PICK MICROPICK 25G (MISCELLANEOUS) IMPLANT
PIK ILLUMINATED 25G (OPHTHALMIC) IMPLANT
PROBE DIATHERMY DSP 27GA (MISCELLANEOUS) IMPLANT
PROBE ENDO DIATHERMY 25G (MISCELLANEOUS) IMPLANT
PROBE LASER ILLUM FLEX CVD 25G (OPHTHALMIC) IMPLANT
REPL STRA BRUSH NEEDLE (NEEDLE) IMPLANT
RESERVOIR BACK FLUSH (MISCELLANEOUS) IMPLANT
SCRAPER DIAMOND 25GA (OPHTHALMIC RELATED) IMPLANT
SHIELD EYE LENSE ONLY DISP (GAUZE/BANDAGES/DRESSINGS) ×3 IMPLANT
SUT ETHILON 5.0 S-24 (SUTURE) ×3 IMPLANT
SUT ETHILON 9 0 TG140 8 (SUTURE) IMPLANT
SUT SILK 2 0 (SUTURE) ×3
SUT SILK 2 0 TIES 17X18 (SUTURE) ×3
SUT SILK 2-0 18XBRD TIE 12 (SUTURE) ×1 IMPLANT
SUT SILK 2-0 18XBRD TIE BLK (SUTURE) ×1 IMPLANT
SUT VICRYL 7 0 TG140 8 (SUTURE) ×3 IMPLANT
SYR 10ML LL (SYRINGE) ×3 IMPLANT
SYR 20ML LL LF (SYRINGE) ×3 IMPLANT
SYR 5ML LL (SYRINGE) ×3 IMPLANT
SYR BULB 3OZ (MISCELLANEOUS) ×3 IMPLANT
SYR TB 1ML LUER SLIP (SYRINGE) ×3 IMPLANT
TOWEL GREEN STERILE FF (TOWEL DISPOSABLE) ×3 IMPLANT
TRAY FOLEY W/BAG SLVR 14FR (SET/KITS/TRAYS/PACK) ×3 IMPLANT
TUBING HIGH PRESS EXTEN 6IN (TUBING) IMPLANT
WATER STERILE IRR 1000ML POUR (IV SOLUTION) ×3 IMPLANT

## 2019-11-25 NOTE — Discharge Instructions (Signed)
POSTOPERATIVE INSTRUCTIONS  Your doctor has performed vitreoretinal surgery on you at [facility]. [facility].  - Keep eye patched and shielded until seen by Dr. Vanessa Barbara 815 AM tomorrow in clinic - Do not use drops until return - FACE DOWN POSITIONING WHILE AWAKE - Sleep with belly down or on LEFT side w/ face towards the ground, avoid laying flat on back.    - No strenuous bending, stooping or lifting.  - You may not drive until further notice.  - If your doctor used a gas bubble in your eye during the procedure he will advise you on postoperative positioning. If you have a gas bubble you will be wearing a green bracelet that was applied in the operating room. The green bracelet should stay on as long as the gas bubble is in your eye. While the gas bubble is present you should not fly in an airplane. If you require general anesthesia while the gas bubble is present you must notify your anesthesiologist that an intraocular gas bubble is present so he can take the appropriate precautions.  - Tylenol or any other over-the-counter pain reliever can be used according to your doctor. If more pain medicine is required, your doctor will have a prescription for you.  - You may read, go up and down stairs, and watch television.     Rennis Chris, M.D., Ph.D.

## 2019-11-25 NOTE — Anesthesia Procedure Notes (Signed)
Procedure Name: Intubation Date/Time: 11/25/2019 11:13 AM Performed by: Elliot Dally, CRNA Pre-anesthesia Checklist: Patient identified, Emergency Drugs available, Suction available and Patient being monitored Patient Re-evaluated:Patient Re-evaluated prior to induction Oxygen Delivery Method: Circle System Utilized Preoxygenation: Pre-oxygenation with 100% oxygen Induction Type: IV induction Ventilation: Mask ventilation without difficulty Laryngoscope Size: Miller and 2 Grade View: Grade I Tube type: Oral Tube size: 7.0 mm Number of attempts: 1 Airway Equipment and Method: Stylet and Oral airway Placement Confirmation: ETT inserted through vocal cords under direct vision,  positive ETCO2 and breath sounds checked- equal and bilateral Secured at: 19 cm Tube secured with: Tape Dental Injury: Teeth and Oropharynx as per pre-operative assessment

## 2019-11-25 NOTE — Interval H&P Note (Signed)
History and Physical Interval Note:  11/25/2019 9:34 AM  Mariah Cortez  has presented today for surgery, with the diagnosis of right eye, retinal detachment.  The various methods of treatment have been discussed with the patient and family. After consideration of risks, benefits and other options for treatment, the patient has consented to  Procedure(s): VITRECTOMY 25 GAUGE WITH SCLERAL BUCKLE (Right) as a surgical intervention.  The patient's history has been reviewed, patient examined, no change in status, stable for surgery.  I have reviewed the patient's chart and labs.  Questions were answered to the patient's satisfaction.     Rennis Chris

## 2019-11-25 NOTE — Op Note (Signed)
Date of procedure: 3.18.21   Surgeon: [name], MD, PhD   Assistant: Ernest Mallick, Ophthalmic Assistant    Pre-operative Diagnosis: Macula-involving Retinal Detachment, Right Eye   Post-operative diagnosis: Macula-involving Retinal Detachment, Right Eye   Anesthesia: GETA   Procedures: 1)     #41 Scleral Buckle, Right Eye 2)     25 gauge pars plana vitrectomy, Right Eye CPT [phone] 3)     Perfluorocarbon injection 4)     Fluid-air exchange, Right Eye 5)     Endolaser, Right Eye 5)     Injection of 66% A6T0 gas   Complications: none Estimated blood loss: minimal Specimens: none   Brief history:   The patient has a history of decreased vision in the affected right eye, and on examination, was noted to have a macula-involving retinal detachment, affecting activities of daily living.  The risks, benefits, and alternatives were explained to the patient, including pain, bleeding, infection, loss of vision, double vision, droopy eyelids, and need for more surgeries.  Informed consent was obtained from the patient and placed in the chart.     Procedure:             The patient was brought to the preoperative holding area where the correct eye was confirmed and marked.  The patient was then brought to the operating room where general endotracheal anesthesia was induced. A secondary time-out was performed to identify the correct patient, eye, procedures, and any allergies. The right eye was prepped and draped in the usual sterile ophthalmic fashion followed by placement of a lid speculum.             A 360 conjunctival peritomy was created using Westcott scissors and 0.12 forceps. Each of the four quadrants between the rectus muscles was dissected using Stevens scissors to detach Tenon's attachments from the globe. Each of the four rectus muscles was isolated on a muscle hook and slung using 2-0 Silk suture in the usual standard fashion. Each of the four quadrants between the rectus muscles was  inspected and there were noted to be no areas of scleral thinning. A #16 silicone band was then brought onto the field and was threaded under each rectus muscle. The band was then loosely secured using a #70 Watzke sleeve in the inferotemporal quadrant. The band was then sutured to the sclera in each quadrant using 5-0 nylon sutures passed partial thickness through the sclera in a horizontal mattress fashion. The scleral buckle was then tightened to the appropriate height with two locking needle drivers. Attention was then turned to the vitrectomy portion of the procedure.              A 25 gauge trocar was placed in the inferotemporal quadrant in a beveled fashion. A 4 mm infusion cannula was placed through this trocar, and the infusion cannula was confirmed in the vitreous cavity with no incarceration of retina or choroid prior to turning it on. Two additional 25 gauge trocars were placed in the superonasal and superotemporal quadrants (2 and 10 oclock, respectively) in a similar beveled fashion. At this time, a standard three-port pars plana vitrectomy was performed using the light pipe, the cutter, and the BIOM viewing system. A thorough core and peripheral vitreous dissection was performed. A posterior vitreous detachment was confirmed over the optic nerve. There was a bullous inferior retinal detachment extending from 0200 to 1030 w/ macular involvement. The fovea was detached. On inspection of the peripheral retina, there was no frank retinal  tear, but the peripheral retina was thin and atrophic with micro-holes in the superotemporal periphery. Of note, the cortical vitreous and vitreous base were quite adherent to the retinal surface.             Following meticulous dissection of the vitreous from the retinal surface, perfluoron was injected to push the subretinal fluid anterior to the scleral buckle. The subretinal fluid / schleren ballooned the anterior retina without any significant drainage of  subretinal fluid occurred. A small retinotomy was made at 0900 over the buckle and thick schleren / subreitnal fluid was drained there, flattening the peripheral retina over the buckle.  A complete fluid-air exchange was performed with a soft tip extrusion cannula over the 0900 retinotomy, then posteriorly to remove the perfluoron. After completion of these maneuvers, the retina was flat over the macula and over the scleral buckle. Under air, endolaser was applied to the retinotomy and over and posterior to the scleral buckle 360.             At this time, the buckle height was confirmed and the buckle was finalized by trimming the band ends. The superotemporal trocar was removed and sutured with 7-0 vicryl in an interrupted fashion.  A complete air to 14% C3F8 gas exchange was performed through the infusion cannula and vented through the superonasal trocar using the extrusion cannula. The superonasal trocar and infusion cannula and associated trocar were then removed and sutured with 7-0 vicryl in an interrupted fashion. Kefzol + polymixin irrigation was then used over the buckle. A subtenon's block containing 0.75% marcaine and 2% lidocaine was administered.              The conjunctiva was closed with 7-0 vicryl sutures. The eye's intraocular pressure was confirmed to be at a physiologic level by digital palpation. Subconjunctival injections of Antibiotic and kenalog were administered. The lid speculum and drapes were removed. Drops of an antibiotic, antihypertensives, and steroid were given. Copious antibiotic ointment was instilled into the eye. The eye was patched and shielded. The patient tolerated the procedure well without any intraoperative or immediate postoperative complications. The patient was taken to the recovery room in good condition. The patient was instructed to maintain a strict face-down position and will be seen by Dr. Vanessa Barbara tomorrow morning in clinic.

## 2019-11-25 NOTE — Anesthesia Postprocedure Evaluation (Signed)
Anesthesia Post Note  Patient: MAFALDA MCGINNISS  Procedure(s) Performed: VITRECTOMY 25 GAUGE WITH SCLERAL BUCKLE (Right Eye) Photocoagulation (Right Eye) Gas/Fluid Exchange (Right Eye)     Patient location during evaluation: PACU Anesthesia Type: General Level of consciousness: awake and alert Pain management: pain level controlled Vital Signs Assessment: post-procedure vital signs reviewed and stable Respiratory status: spontaneous breathing, nonlabored ventilation, respiratory function stable and patient connected to nasal cannula oxygen Cardiovascular status: blood pressure returned to baseline and stable Postop Assessment: no apparent nausea or vomiting Anesthetic complications: no    Last Vitals:  Vitals:   11/25/19 1455 11/25/19 1510  BP: 117/71 (!) 126/59  Pulse: 96 84  Resp: 18 16  Temp: 37.5 C   SpO2: 98% 95%    Last Pain:  Vitals:   11/25/19 1455  PainSc: Asleep                 Deretha Ertle,W. EDMOND

## 2019-11-25 NOTE — Brief Op Note (Signed)
11/25/2019  2:53 PM  PATIENT:  Mariah Cortez  69 y.o. female  PRE-OPERATIVE DIAGNOSIS:  right eye, retinal detachment  POST-OPERATIVE DIAGNOSIS:  right eye, retinal detachment  PROCEDURE:  Procedure(s): VITRECTOMY 25 GAUGE WITH SCLERAL BUCKLE (Right) Photocoagulation (Right) Gas/Fluid Exchange (Right)  SURGEON:  Surgeon(s) and Role:    Rennis Chris, MD - Primary  ASSISTANTS: Laurian Brim, Ophthalmic Assistant    ANESTHESIA:   local and general  EBL:  2 mL   BLOOD ADMINISTERED:none  DRAINS: none   LOCAL MEDICATIONS USED:  BUPIVICAINE , XYLOCAINE  and Amount: 8 ml  SPECIMEN:  No Specimen  DISPOSITION OF SPECIMEN:  N/A  COUNTS:  YES  TOURNIQUET:  * No tourniquets in log *  DICTATION: .Note written in EPIC  PLAN OF CARE: Discharge to home after PACU  PATIENT DISPOSITION:  PACU - hemodynamically stable.   Delay start of Pharmacological VTE agent (>24hrs) due to surgical blood loss or risk of bleeding: not applicable

## 2019-11-25 NOTE — Transfer of Care (Signed)
Immediate Anesthesia Transfer of Care Note  Patient: Mariah Cortez  Procedure(s) Performed: VITRECTOMY 25 GAUGE WITH SCLERAL BUCKLE (Right Eye) Photocoagulation (Right Eye) Gas/Fluid Exchange (Right Eye)  Patient Location: PACU  Anesthesia Type:General  Level of Consciousness: drowsy and patient cooperative  Airway & Oxygen Therapy: Patient Spontanous Breathing and Patient connected to nasal cannula oxygen  Post-op Assessment: Report given to RN and Post -op Vital signs reviewed and stable  Post vital signs: Reviewed and stable  Last Vitals:  Vitals Value Taken Time  BP 117/71 11/25/19 1457  Temp    Pulse 92 11/25/19 1458  Resp 18 11/25/19 1457  SpO2 96 % 11/25/19 1458  Vitals shown include unvalidated device data.  Last Pain: There were no vitals filed for this visit.       Complications: No apparent anesthesia complications

## 2019-11-25 NOTE — Anesthesia Preprocedure Evaluation (Addendum)
Anesthesia Evaluation  Patient identified by MRN, date of birth, ID band Patient awake    Reviewed: Allergy & Precautions, H&P , NPO status , Patient's Chart, lab work & pertinent test results  Airway Mallampati: II  TM Distance: >3 FB Neck ROM: Full    Dental no notable dental hx. (+) Teeth Intact, Dental Advisory Given   Pulmonary neg pulmonary ROS,    Pulmonary exam normal breath sounds clear to auscultation       Cardiovascular hypertension, Pt. on medications  Rhythm:Regular Rate:Normal     Neuro/Psych  Headaches, negative psych ROS   GI/Hepatic Neg liver ROS, GERD  Medicated and Controlled,  Endo/Other  diabetes, Type 2, Oral Hypoglycemic AgentsMorbid obesity  Renal/GU negative Renal ROS  negative genitourinary   Musculoskeletal  (+) Arthritis , Osteoarthritis,    Abdominal   Peds  Hematology negative hematology ROS (+)   Anesthesia Other Findings   Reproductive/Obstetrics negative OB ROS                            Anesthesia Physical Anesthesia Plan  ASA: III  Anesthesia Plan: General   Post-op Pain Management:    Induction: Intravenous  PONV Risk Score and Plan: 4 or greater and Ondansetron, Dexamethasone and Treatment may vary due to age or medical condition  Airway Management Planned: Oral ETT  Additional Equipment:   Intra-op Plan:   Post-operative Plan: Extubation in OR  Informed Consent: I have reviewed the patients History and Physical, chart, labs and discussed the procedure including the risks, benefits and alternatives for the proposed anesthesia with the patient or authorized representative who has indicated his/her understanding and acceptance.     Dental advisory given  Plan Discussed with: CRNA  Anesthesia Plan Comments:         Anesthesia Quick Evaluation

## 2019-11-26 ENCOUNTER — Encounter (INDEPENDENT_AMBULATORY_CARE_PROVIDER_SITE_OTHER): Payer: Self-pay | Admitting: Ophthalmology

## 2019-11-26 ENCOUNTER — Ambulatory Visit (INDEPENDENT_AMBULATORY_CARE_PROVIDER_SITE_OTHER): Payer: Medicare PPO | Admitting: Ophthalmology

## 2019-11-26 DIAGNOSIS — H25813 Combined forms of age-related cataract, bilateral: Secondary | ICD-10-CM

## 2019-11-26 DIAGNOSIS — H3581 Retinal edema: Secondary | ICD-10-CM

## 2019-11-26 DIAGNOSIS — I10 Essential (primary) hypertension: Secondary | ICD-10-CM

## 2019-11-26 DIAGNOSIS — H35033 Hypertensive retinopathy, bilateral: Secondary | ICD-10-CM

## 2019-11-26 DIAGNOSIS — H3321 Serous retinal detachment, right eye: Secondary | ICD-10-CM

## 2019-11-26 NOTE — Progress Notes (Signed)
[facility] Note  11/26/2019     CHIEF COMPLAINT Patient presents for Post-op Follow-up   HISTORY OF PRESENT ILLNESS: Mariah Cortez is a 69 y.o. female who presents to the clinic today for:   HPI    Post-op Follow-up    In right eye.  Discomfort includes pain, itching, foreign body sensation, discharge and floaters.  Vision is blurred at distance, is blurred at near and is worse.  I, the attending physician,  performed the HPI with the patient and updated documentation appropriately.          Comments    69 y/o female pt here for 1 day po s/p SBP+PPV OD 11/25/19.  Pt barely slept last night due to being unable to get comfortable with face-down positioning.  Has had pain OD at about a 7 ever since yesterday evening.  Eye patch removed in office this morning.  VA OD very blurred.  No change in [place] OS.  Pt states last night, even with patch on she could see a "large blob with a bright border" centrally OD, and reports seeing what looks like "droplets of blood" in her right eye this morning.  Has not yet started po gtts.       Last edited by [name], MD on 11/26/2019  8:44 AM. (History)    pt states she had a rough night, she had to take Tylenol for pain last night and this morning, she states she slept sitting up with her face down   Referring physician: [name], [address],  [place] [id number]  HISTORICAL INFORMATION:   Selected notes from the MEDICAL RECORD NUMBER Referred by Dr. Orion Modest for concern of mac off RD  LEE:  Ocular Hx- PMH-   CURRENT MEDICATIONS: No current outpatient medications on file. (Ophthalmic Drugs)   No current facility-administered medications for this visit. (Ophthalmic Drugs)   Current Outpatient Medications (Other)  Medication Sig  . APPLE CIDER VINEGAR PO Take 450 mg by mouth daily.  . Ascorbic Acid (VITAMIN C CR) 1000 MG TBCR Take 1,000 mg by mouth daily.    [name] aspirin 81 MG tablet Take 81 mg by mouth daily.    . cetirizine (ZYRTEC) 10 MG tablet Take 10 mg by mouth daily.    . Cholecalciferol (VITAMIN D3) 50 MCG (2000 UT) TABS Take 2,000 mg by mouth daily.  [name] esomeprazole (NEXIUM) 20 MG capsule Take 20 mg by mouth daily as needed (Heartburn).  . Ferrous Sulfate (IRON) 325 (65 FE) MG TABS Take 325 mg by mouth in the morning and at bedtime.   [name] glimepiride (AMARYL) 4 MG tablet Take 4 mg by mouth 2 (two) times daily.  . hydrochlorothiazide (HYDRODIURIL) 25 MG tablet Take 25 mg by mouth daily.  [name] HYDROcodone-acetaminophen (NORCO/VICODIN) 5-325 MG tablet Take 1 tablet by mouth every 4 (four) hours as needed for moderate pain.  [name] losartan (COZAAR) 50 MG tablet Take 50 mg by mouth daily.  [name] lovastatin (MEVACOR) 40 MG tablet Take 40 mg by mouth 2 (two) times daily.    . meloxicam (MOBIC) 7.5 MG tablet Take 7.5 mg by mouth daily.  . metFORMIN (GLUCOPHAGE) 1000 MG tablet Take 1,000 mg by mouth in the morning and at bedtime.   . Misc Natural Products (OSTEO BI-FLEX JOINT SHIELD PO) Take 1 tablet by mouth in the morning and at bedtime.   . Multiple Vitamin (MULTIVITAMIN) capsule Take 1  capsule by mouth daily.    . pantoprazole (PROTONIX) 40 MG tablet Take 40 mg by mouth daily.  . pioglitazone (ACTOS) 30 MG tablet Take 30 mg by mouth daily.  . Potassium 99 MG TABS Take 99 mg by mouth daily.  . traMADol (ULTRAM) 50 MG tablet Take 50 mg by mouth 2 (two) times daily. Additional  50 mg if needed  . vitamin B-12 (CYANOCOBALAMIN) 1000 MCG tablet Take 1,000 mcg by mouth daily.   No current facility-administered medications for this visit. (Other)      REVIEW OF SYSTEMS: ROS    Positive for: Endocrine, Eyes   Negative for: Constitutional, Gastrointestinal, Neurological, Skin, Genitourinary, Musculoskeletal, HENT, Cardiovascular, Respiratory, Psychiatric, Allergic/Imm, Heme/Lymph   Last edited by Matthew Folks, COA on 11/26/2019  8:33 AM. (History)        ALLERGIES Allergies  Allergen Reactions  . Latex Rash    Latex tape  . Morphine And Related Itching    PAST MEDICAL HISTORY Past Medical History:  Diagnosis Date  . Allergies   . Arthritis   . Cataracts, bilateral   . Diabetes mellitus   . GERD (gastroesophageal reflux disease)   . Headache   . Hyperlipidemia   . Hypertension   . Retinal detachment    right eye  . Stroke [facility])    " mild"  . Wears glasses    Past Surgical History:  Procedure Laterality Date  . COLONOSCOPY    . EYE SURGERY Right 11/25/2019   SBP+PPV for RD repair - Dr. [name]  . GAS/FLUID EXCHANGE Right 11/25/2019   Procedure: Gas/Fluid Exchange;  Surgeon: [name], MD;  Location: [facility];  Service: Ophthalmology;  Laterality: Right;  . JOINT REPLACEMENT     B/L  . LIPOMA RESECTION     Dr. [name]  . PHOTOCOAGULATION Right 11/25/2019   Procedure: Photocoagulation;  Surgeon: [name], MD;  Location: [facility];  Service: Ophthalmology;  Laterality: Right;  . REPLACEMENT TOTAL KNEE  2010   Right, Dr. [name]  . REPLACEMENT TOTAL KNEE  2013   left  . RETINAL DETACHMENT SURGERY Right 11/25/2019   SBP+PPV - Dr. [name]  . VITRECTOMY 25 GAUGE WITH SCLERAL BUCKLE Right 11/25/2019   Procedure: VITRECTOMY 25 GAUGE WITH SCLERAL BUCKLE;  Surgeon: [name], MD;  Location: [facility];  Service: Ophthalmology;  Laterality: Right;    FAMILY HISTORY Family History  Problem Relation Age of Onset  . Stroke Mother   . Hypertension Mother   . Diabetes Sister   . Arthritis Maternal Aunt     SOCIAL HISTORY Social History   Tobacco Use  . Smoking status: Never Smoker  . Smokeless tobacco: Never Used  Substance Use Topics  . Alcohol use: Yes    Comment: occassionaly  . Drug use: Never         OPHTHALMIC EXAM:  Base Eye Exam    Visual Acuity (Snellen - Linear)      Right Left   Dist [place] HM    Dist ph Ruch NI        Tonometry (Tonopen, 8:37 AM)      Right Left   Pressure 18         Pupils      Dark Light Shape React APD   Right 4 4 Round No None   Left 3 2 Round Brisk None  Pharm dil OD       Visual Fields (Counting fingers)      Left Right  Full    Restrictions  Total superior temporal, inferior temporal, superior nasal, inferior nasal deficiencies       Extraocular Movement      Right Left    Full, Ortho Full, Ortho       Neuro/Psych    Oriented x3: Yes   Mood/Affect: Normal       Dilation    Right eye: 1.0% Mydriacyl, 2.5% Phenylephrine @ 8:37 AM        Slit Lamp and Fundus Exam    Slit Lamp Exam      Right Left   Lids/Lashes Dermatochalasis - upper lid Dermatochalasis - upper lid   Conjunctiva/Sclera Subconjunctival hemorrhage, sutures intact White and quiet   Cornea Central epi defect, Descemet's folds 1-2+Punctate epithelial erosions   Anterior Chamber clear, narrow angles Deep and quiet   Iris Round and dilated, No NVI Round and dilated, No NVI   Lens 2-3+ Nuclear sclerosis, 2-3+ Cortical cataract, 2+PC feathering 2-3+ Nuclear sclerosis, 2-3+ Cortical cataract   Vitreous post vitrectomy, good gas fill Vitreous syneresis, Posterior vitreous detachment       Fundus Exam      Right Left   Disc hazy view, perfused    C/D Ratio 0.2 0.2   Macula hazy view, attached under gas    Vessels Hazy view, Vascular attenuation, mild Tortuousity    Periphery Hazy view, retina attached over buckle, good buckle height, good laser over buckle 360; ORIGINALLY: Inferior detachment from 0200-1030, Subretinal band IT midzone, ?small tear at 1030           IMAGING AND PROCEDURES  Imaging and Procedures for _0 @           ASSESSMENT/PLAN:    ICD-10-CM   1. Right retinal detachment  H33.21   2. Retinal edema  H35.81   3. Essential hypertension  I10   4. Hypertensive retinopathy of both eyes  H35.033   5. Combined forms of age-related cataract of both eyes  H25.813     1,2. Retinal detachment, right eye  - bullous inferior mac off  detachment -- likely chronic - pt unsure of symptom onset  - detached inferiorly from 0200 to 1030, fovea off, ?Small tear at 1030 -- no obvious large tear  - now POD1 s/p SBP + PPV/PFC/EL/FAX/14% C3F8 OD, 03.18.21  - intra op - atrophic peripheral retina w/ microtears -- no frank/large tear             - doing well this morning             - retina attached and in good position -- good buckle height and laser around breaks             - IOP okay at 18             - start   PF 4x/day OD                          zymaxid QID OD                          Atropine BID OD                          Brimonidine BID OD                          PSO  ung QID OD              - cont face down positioning x3 days; avoid laying flat on back              - eye shield when sleeping              - post op drop and positioning instructions reviewed              - tylenol/ibuprofen for pain              - Rx given for breakthrough pain  - 1 week  3,4. Hypertensive retinopathy OU  - discussed importance of tight BP control  - monitor  5. Mixed form age related cataracts OU  - The symptoms of cataract, surgical options, and treatments and risks were discussed with patient.  - discussed diagnosis and progression  - specifically discussed likelihood of progression of cataract OD following RD repair   Ophthalmic Meds Ordered this visit:  No orders of the defined types were placed in this encounter.      Return in about 1 week (around 12/03/2019) for f/u RD OD, DFE.  There are no Patient Instructions on file for this visit.   Explained the diagnoses, plan, and follow up with the patient and they expressed understanding.  Patient expressed understanding of the importance of proper follow up care.   This document serves as a record of services personally performed by Gardiner Sleeper, MD, PhD. It was created on their behalf by Ernest Mallick, OA, an ophthalmic assistant. The creation of this record is the  provider's dictation and/or activities during the visit.    Electronically signed by: Ernest Mallick, OA 03.19.2021 1:15 AM   Gardiner Sleeper, M.D., Ph.D. Diseases & Surgery of the Retina and Vitreous [facility]  I have reviewed the above documentation for accuracy and completeness, and I agree with the above. Gardiner Sleeper, M.D., Ph.D. 11/28/19 1:15 AM    Abbreviations: M myopia (nearsighted); A astigmatism; H hyperopia (farsighted); P presbyopia; Mrx spectacle prescription;  CTL contact lenses; OD right eye; OS left eye; OU both eyes  XT exotropia; ET esotropia; PEK punctate epithelial keratitis; PEE punctate epithelial erosions; DES dry eye syndrome; MGD meibomian gland dysfunction; ATs artificial tears; PFAT's preservative free artificial tears; [place] nuclear sclerotic cataract; PSC posterior subcapsular cataract; ERM epi-retinal membrane; PVD posterior vitreous detachment; RD retinal detachment; DM diabetes mellitus; DR diabetic retinopathy; NPDR non-proliferative diabetic retinopathy; PDR proliferative diabetic retinopathy; CSME clinically significant macular edema; DME diabetic macular edema; dbh dot blot hemorrhages; CWS cotton wool spot; POAG primary open angle glaucoma; C/D cup-to-disc ratio; HVF humphrey visual field; GVF goldmann visual field; OCT optical coherence tomography; IOP intraocular pressure; BRVO Branch retinal vein occlusion; CRVO central retinal vein occlusion; CRAO central retinal artery occlusion; BRAO branch retinal artery occlusion; RT retinal tear; SB scleral buckle; PPV pars plana vitrectomy; VH Vitreous hemorrhage; PRP panretinal laser photocoagulation; IVK intravitreal kenalog; VMT vitreomacular traction; MH Macular hole;  NVD neovascularization of the disc; NVE neovascularization elsewhere; AREDS age related eye disease study; ARMD age related macular degeneration; POAG primary open angle glaucoma; EBMD epithelial/anterior basement membrane  dystrophy; ACIOL anterior chamber intraocular lens; IOL intraocular lens; PCIOL posterior chamber intraocular lens; Phaco/IOL phacoemulsification with intraocular lens placement; Elias-Fela Solis photorefractive keratectomy; LASIK laser assisted in situ keratomileusis; HTN hypertension; DM diabetes mellitus; COPD chronic obstructive pulmonary disease

## 2019-12-01 IMAGING — MR MR HEAD WO/W CM
13 series · 46 of 48 positions shown · IV contrast (gadavist)
Comparison: Report of noncontrast head CT 04/07/1997 (no images
available).

CLINICAL DATA: 68-year-old female with persistent daily headache
for several months. Pain behind the eyes with light sensitivity.

EXAM:
MRI HEAD WITHOUT AND WITH CONTRAST
TECHNIQUE: Multiplanar, multiecho pulse sequences of the brain and surrounding
structures were obtained without and with intravenous contrast.
CONTRAST:  10mL GADAVIST GADOBUTROL 1 MMOL/ML IV SOLN

[Series 5: ax dwi_tracew · axial · 3.0mm · 0.60mm/px · z∈[+10,+163]mm · 2 of 48 slices shown]
[im 1/48]
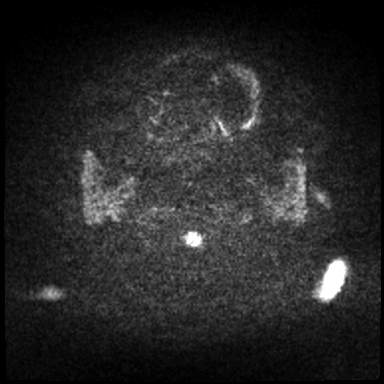
[im 48/48]
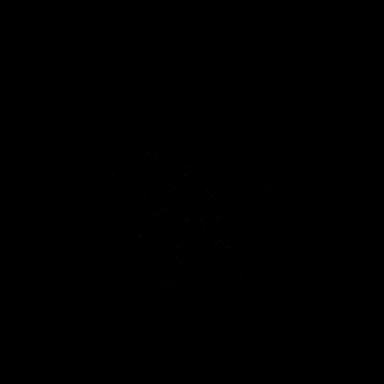

[Series 6: ax dwi_adc · axial · 3.0mm · 0.60mm/px · z∈[+10,+150]mm · 3 of 44 slices shown]
[im 1/44]
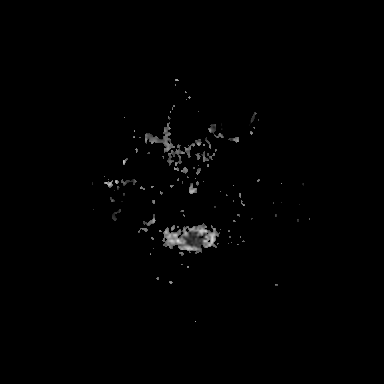
[im 22/44]
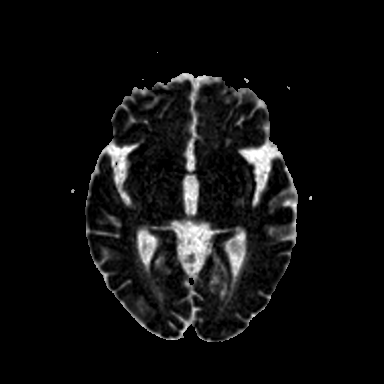
[im 44/44]
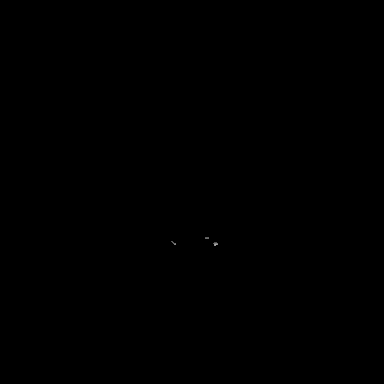

[Series 7: cor dwi_tracew · coronal · 5.0mm · 0.60mm/px · 2 of 40 slices shown]
[im 1/40]
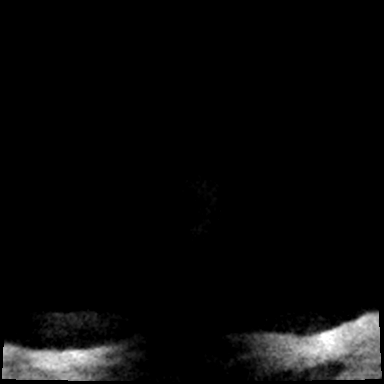
[im 40/40]
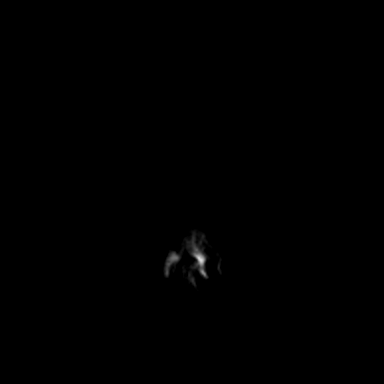

[Series 8: cor dwi_adc · coronal · 5.0mm · 0.60mm/px · 2 of 32 slices shown]
[im 1/32]
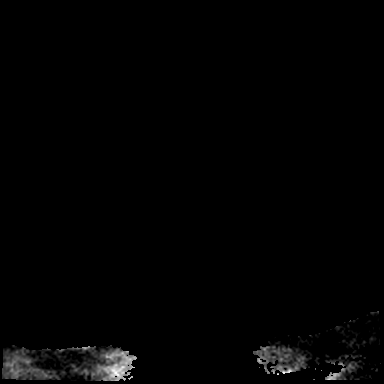
[im 32/32]
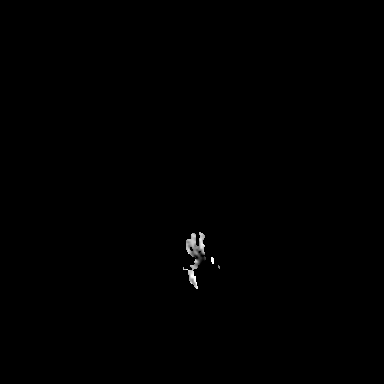

[Series 9: T1 · sagittal · 5.0mm · 0.62mm/px · 1 of 23 slices shown (1 of 2)]
[im 1/23]
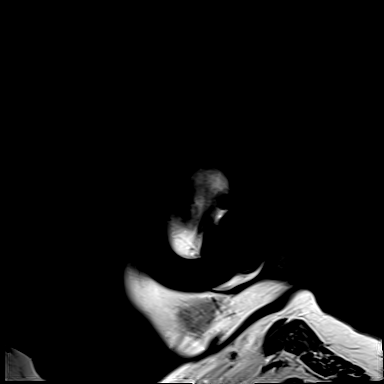

[Series 10: T2 · axial · 5.0mm · 0.53mm/px · z∈[+15,+157]mm · 2 of 25 slices shown]
[im 1/25]
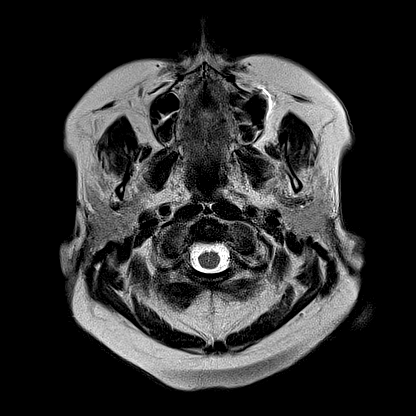
[im 25/25]
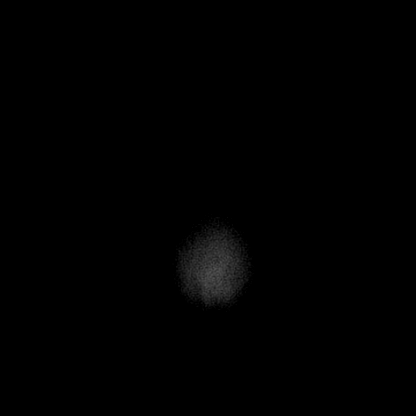

[Series 15: FLAIR · axial · 3.0mm · 0.53mm/px · z∈[+6,+166]mm · 3 of 55 slices shown]
[im 1/55]
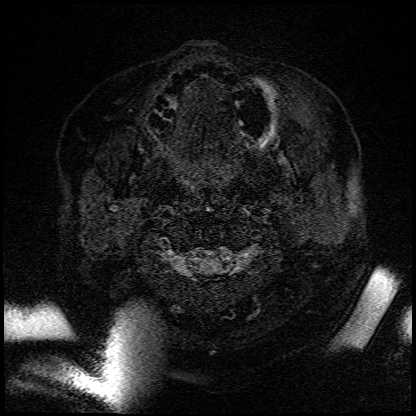
[im 28/55]
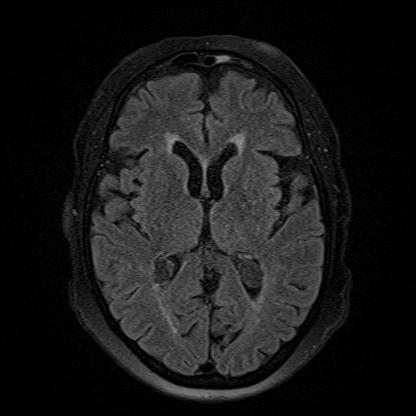
[im 55/55]
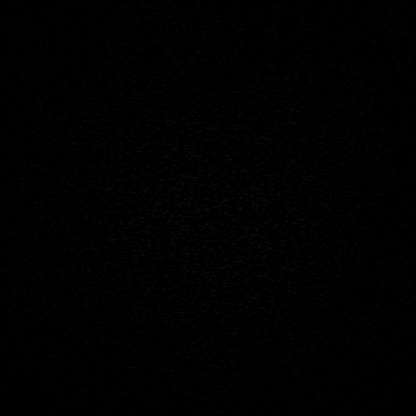

[Series 16: T1 · axial · 1.0mm · 0.98mm/px · z∈[+2,+174]mm · 8 of 172 slices shown (2 of 2)]
[im 1/172]
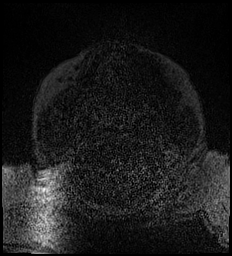
[im 20/172]
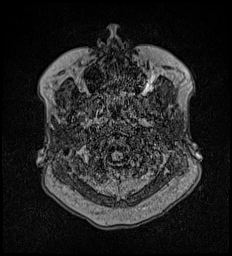
[im 58/172]
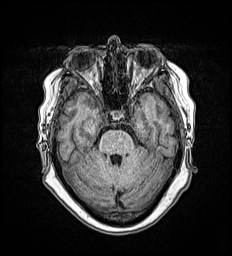
[im 77/172]
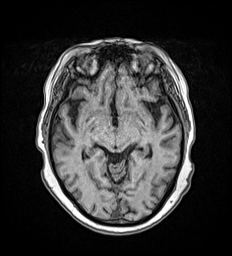
[im 96/172]
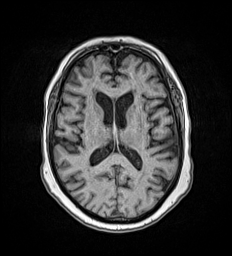
[im 115/172]
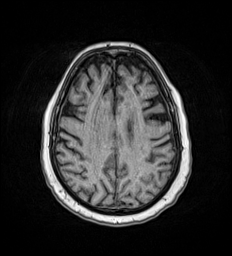
[im 153/172]
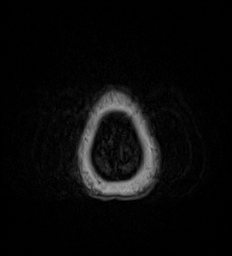
[im 172/172]
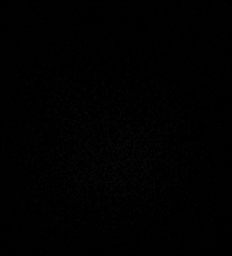

[Series 18: pha_images · axial · 3.0mm · 0.90mm/px · z∈[-0,+171]mm · 4 of 59 slices shown]
[im 1/59]
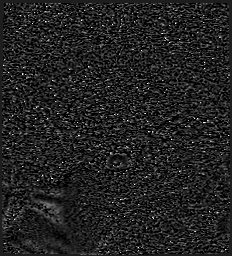
[im 20/59]
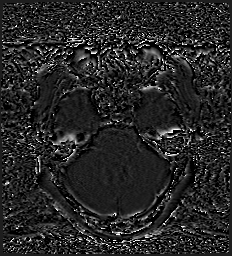
[im 39/59]
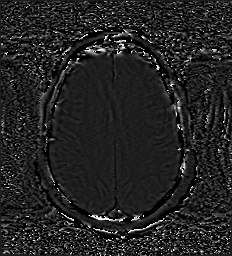
[im 59/59]
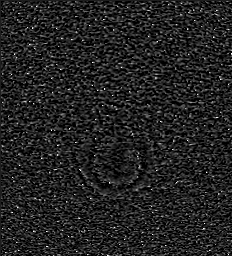

[Series 19: swi_images · axial · 3.0mm · 0.90mm/px · z∈[-0,+174]mm · 4 of 60 slices shown]
[im 1/60]
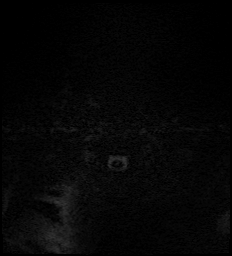
[im 20/60]
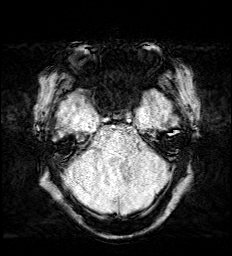
[im 40/60]
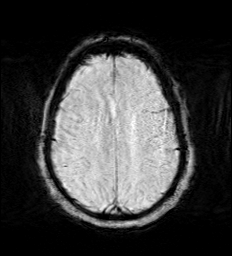
[im 60/60]
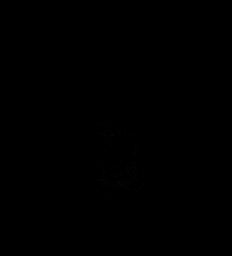

[Series 25: T2 post-contrast · coronal · 5.0mm · 0.57mm/px · 2 of 29 slices shown]
[im 1/29]
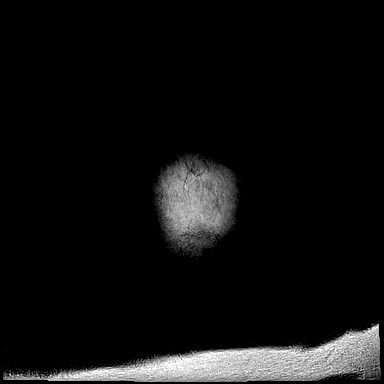
[im 29/29]
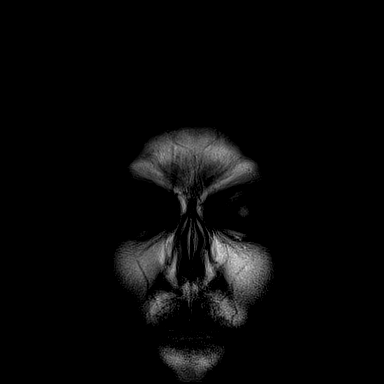

[Series 26: T1 post-contrast · axial · 1.0mm · 0.98mm/px · z∈[+2,+175]mm · 11 of 174 slices shown (1 of 2)]
[im 1/174]
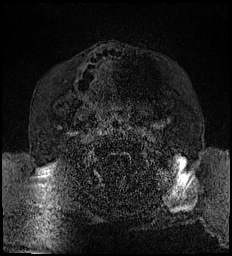
[im 18/174]
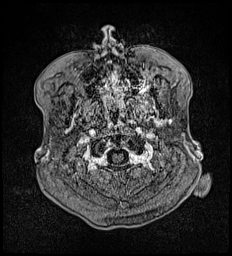
[im 35/174]
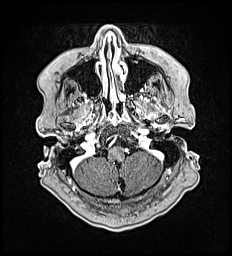
[im 52/174]
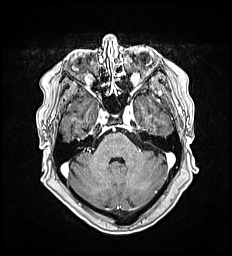
[im 70/174]
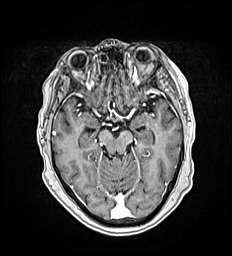
[im 87/174]
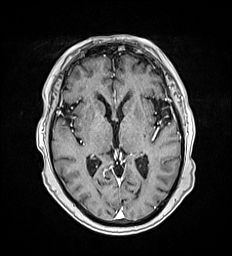
[im 104/174]
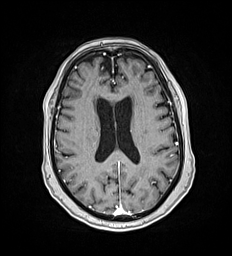
[im 122/174]
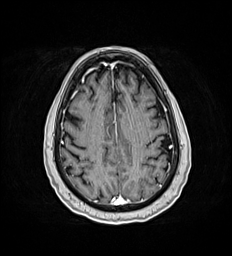
[im 139/174]
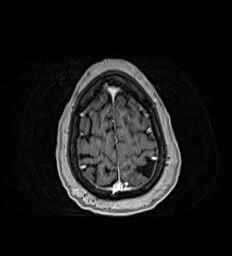
[im 156/174]
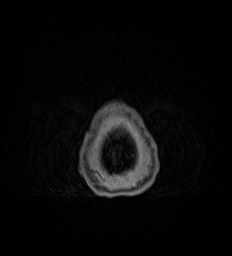
[im 174/174]
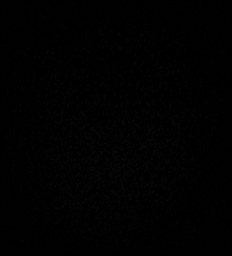

[Series 27: T1 post-contrast · coronal · 5.0mm · 0.57mm/px · 2 of 29 slices shown (2 of 2)]
[im 1/29]
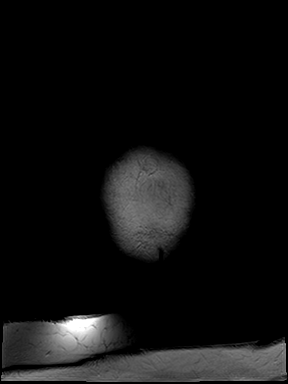
[im 29/29]
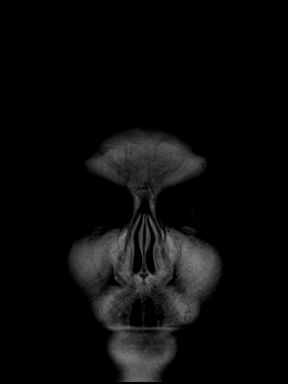

[46 of 48 positions shown; findings below may reference images not displayed]

FINDINGS: Brain: In the left periatrial white matter on series 5, image 23
there is a small 6 millimeter area of abnormal diffusion which
appears mildly facilitated on ADC and T2 and FLAIR hyperintense with
partial T1 hypointensity. No enhancement. Additional scattered
bilateral cerebral white matter T2 and FLAIR hyperintensity is mild
for age and in a nonspecific configuration.

No other diffusion abnormality. Cerebral volume is within normal
limits for age. No midline shift, mass effect, evidence of mass
lesion, ventriculomegaly, extra-axial collection or acute
intracranial hemorrhage. Cervicomedullary junction and pituitary are
within normal limits.

No cortical encephalomalacia, a normal sulcal variation is suspected
at the left vertex on series 15, image 41. No chronic blood products
identified. The deep gray nuclei, brainstem and cerebellum are
normal for age.

No abnormal enhancement identified.  No dural thickening.

Vascular: Major intracranial vascular flow voids are preserved. The
major dural venous sinuses are enhancing and appear to be patent.

Skull and upper cervical spine: Multilevel cervical spine
degeneration with mild spinal stenosis suspected on series 9, image
12. Visualized bone marrow signal is within normal limits.

Sinuses/Orbits: Negative orbits.  Paranasal sinuses are clear.

Other: Mastoids are clear. Visible internal auditory structures
appear normal. Scalp and face soft tissues appear negative.
IMPRESSION: 1. Positive for a small subacute appearing white matter infarct in
the left posterior temporal lobe. No associated hemorrhage or mass
effect.
2. No other acute intracranial abnormality. Elsewhere scattered mild
for age cerebral white matter signal changes are nonspecific, but
most commonly due to chronic small vessel disease.
3. Partially visible cervical spine degeneration with suspected mild
spinal stenosis.

These results will be called to the ordering clinician or
representative by the Radiologist Assistant, and communication
documented in the PACS or zVision Dashboard.

## 2019-12-02 ENCOUNTER — Encounter (INDEPENDENT_AMBULATORY_CARE_PROVIDER_SITE_OTHER): Payer: Self-pay | Admitting: Ophthalmology

## 2019-12-02 ENCOUNTER — Ambulatory Visit (INDEPENDENT_AMBULATORY_CARE_PROVIDER_SITE_OTHER): Payer: Medicare PPO | Admitting: Ophthalmology

## 2019-12-02 DIAGNOSIS — H35033 Hypertensive retinopathy, bilateral: Secondary | ICD-10-CM

## 2019-12-02 DIAGNOSIS — I10 Essential (primary) hypertension: Secondary | ICD-10-CM

## 2019-12-02 DIAGNOSIS — H3321 Serous retinal detachment, right eye: Secondary | ICD-10-CM

## 2019-12-02 DIAGNOSIS — H3581 Retinal edema: Secondary | ICD-10-CM

## 2019-12-02 DIAGNOSIS — H25813 Combined forms of age-related cataract, bilateral: Secondary | ICD-10-CM

## 2019-12-02 MED ORDER — BRIMONIDINE TARTRATE 0.2 % OP SOLN: 1.0000 [drp] | Freq: Two times a day (BID) | OPHTHALMIC | 1 refills | Status: AC

## 2019-12-02 MED ORDER — PREDNISOLONE ACETATE 1 % OP SUSP: 1.0000 [drp] | Freq: Four times a day (QID) | OPHTHALMIC | 0 refills | Status: DC

## 2019-12-02 MED ORDER — BACITRACIN-POLYMYXIN B 500-10000 UNIT/GM OP OINT: TOPICAL_OINTMENT | Freq: Every day | OPHTHALMIC | 3 refills | Status: DC

## 2019-12-02 MED ORDER — GATIFLOXACIN 0.5 % OP SOLN: 1.0000 [drp] | Freq: Four times a day (QID) | OPHTHALMIC | 1 refills | Status: DC

## 2019-12-02 NOTE — Progress Notes (Signed)
[facility] Note  12/02/2019     CHIEF COMPLAINT Patient presents for Post-op Follow-up   HISTORY OF PRESENT ILLNESS: Mariah Cortez is a 69 y.o. female who presents to the clinic today for:   HPI    Post-op Follow-up    In right eye.  Vision is improved.  I, the attending physician,  performed the HPI with the patient and updated documentation appropriately.          Comments    Pt states her vision is improving OD.  Pt has occasional eye pain and complains of burning for a few seconds after use of Atropine OD.  Pt denies any new or worsening floaters or fol OU.       Last edited by [name], MD on 12/02/2019  8:02 AM. (History)    pt states she is having a hard time keeping the face down position, he states he has been using drops and ointment every day as directed  Referring physician: [name], MD [place],  [place] [address]  HISTORICAL INFORMATION:   Selected notes from the MEDICAL RECORD NUMBER Referred by Dr. Orion Modest for concern of mac off RD  LEE:  Ocular Hx- PMH-   CURRENT MEDICATIONS: Current Outpatient Medications (Ophthalmic Drugs)  Medication Sig  . bacitracin-polymyxin b (POLYSPORIN) ophthalmic ointment Place into the right eye at bedtime. Place a 1/2 inch ribbon of ointment into the lower eyelid.  [name] brimonidine (ALPHAGAN) 0.2 % ophthalmic solution Place 1 drop into the right eye in the morning and at bedtime.  [name] gatifloxacin (ZYMAXID) 0.5 % SOLN Place 1 drop into the right eye 4 (four) times daily.  . prednisoLONE acetate (PRED FORTE) 1 % ophthalmic suspension Place 1 drop into the right eye 4 (four) times daily.   No current facility-administered medications for this visit. (Ophthalmic Drugs)   Current Outpatient Medications (Other)  Medication Sig  . APPLE CIDER VINEGAR PO Take 450 mg by mouth daily.  . Ascorbic Acid (VITAMIN C CR) 1000 MG TBCR Take 1,000 mg by  mouth daily.   [name] aspirin 81 MG tablet Take 81 mg by mouth daily.    . cetirizine (ZYRTEC) 10 MG tablet Take 10 mg by mouth daily.    . Cholecalciferol (VITAMIN D3) 50 MCG (2000 UT) TABS Take 2,000 mg by mouth daily.  [name] esomeprazole (NEXIUM) 20 MG capsule Take 20 mg by mouth daily as needed (Heartburn).  . Ferrous Sulfate (IRON) 325 (65 FE) MG TABS Take 325 mg by mouth in the morning and at bedtime.   [name] glimepiride (AMARYL) 4 MG tablet Take 4 mg by mouth 2 (two) times daily.  . hydrochlorothiazide (HYDRODIURIL) 25 MG tablet Take 25 mg by mouth daily.  [name] HYDROcodone-acetaminophen (NORCO/VICODIN) 5-325 MG tablet Take 1 tablet by mouth every 4 (four) hours as needed for moderate pain.  [name] losartan (COZAAR) 50 MG tablet Take 50 mg by mouth daily.  [name] lovastatin (MEVACOR) 40 MG tablet Take 40 mg by mouth 2 (two) times daily.    . meloxicam (MOBIC) 7.5 MG tablet Take 7.5 mg by mouth daily.  . metFORMIN (GLUCOPHAGE) 1000 MG tablet Take 1,000 mg by mouth in the morning and at bedtime.   . Misc Natural Products (OSTEO BI-FLEX JOINT SHIELD PO) Take 1 tablet by mouth in the morning and at bedtime.   . Multiple Vitamin (MULTIVITAMIN) capsule Take 1 capsule by mouth daily.    [name]  pantoprazole (PROTONIX) 40 MG tablet Take 40 mg by mouth daily.  . pioglitazone (ACTOS) 30 MG tablet Take 30 mg by mouth daily.  . Potassium 99 MG TABS Take 99 mg by mouth daily.  . traMADol (ULTRAM) 50 MG tablet Take 50 mg by mouth 2 (two) times daily. Additional  50 mg if needed  . vitamin B-12 (CYANOCOBALAMIN) 1000 MCG tablet Take 1,000 mcg by mouth daily.   No current facility-administered medications for this visit. (Other)      REVIEW OF SYSTEMS: ROS    Positive for: Endocrine, Eyes   Negative for: Constitutional, Gastrointestinal, Neurological, Skin, Genitourinary, Musculoskeletal, HENT, Cardiovascular, Respiratory, Psychiatric, Allergic/Imm, Heme/Lymph   Last edited by [name] on 12/02/2019  7:48 AM. (History)        ALLERGIES Allergies  Allergen Reactions  . Latex Rash    Latex tape  . Morphine And Related Itching    PAST MEDICAL HISTORY Past Medical History:  Diagnosis Date  . Allergies   . Arthritis   . Cataracts, bilateral   . Diabetes mellitus   . GERD (gastroesophageal reflux disease)   . Headache   . Hyperlipidemia   . Hypertension   . Retinal detachment    right eye  . Stroke [facility])    " mild"  . Wears glasses    Past Surgical History:  Procedure Laterality Date  . COLONOSCOPY    . EYE SURGERY Right 11/25/2019   SBP+PPV for RD repair - Dr. [name]  . GAS/FLUID EXCHANGE Right 11/25/2019   Procedure: Gas/Fluid Exchange;  Surgeon: [name], MD;  Location: [place];  Service: Ophthalmology;  Laterality: Right;  . JOINT REPLACEMENT     B/L  . LIPOMA RESECTION     Dr. [name]  . PHOTOCOAGULATION Right 11/25/2019   Procedure: Photocoagulation;  Surgeon: [name], MD;  Location: [place];  Service: Ophthalmology;  Laterality: Right;  . REPLACEMENT TOTAL KNEE  2010   Right, Dr. [name]  . REPLACEMENT TOTAL KNEE  2013   left  . RETINAL DETACHMENT SURGERY Right 11/25/2019   SBP+PPV - Dr. [name]  . VITRECTOMY 25 GAUGE WITH SCLERAL BUCKLE Right 11/25/2019   Procedure: VITRECTOMY 25 GAUGE WITH SCLERAL BUCKLE;  Surgeon: [name], MD;  Location: [place];  Service: Ophthalmology;  Laterality: Right;    FAMILY HISTORY Family History  Problem Relation Age of Onset  . Stroke Mother   . Hypertension Mother   . Diabetes Sister   . Arthritis Maternal Aunt     SOCIAL HISTORY Social History   Tobacco Use  . Smoking status: Never Smoker  . Smokeless tobacco: Never Used  Substance Use Topics  . Alcohol use: Yes    Comment: occassionaly  . Drug use: Never         OPHTHALMIC EXAM:  Base Eye Exam    Visual Acuity (Snellen - Linear)      Right Left   Dist cc HM 20/20 -1   Dist ph cc NI    Correction: Glasses       Tonometry (Tonopen, 7:51 AM)       Right Left   Pressure 16 Def       Pupils      Dark Light Shape React APD   Right Dilated       Left 2 1 Round Minimal 0       Visual Fields      Left Right    Full    Restrictions  Total  superior temporal, inferior temporal, superior nasal, inferior nasal deficiencies       Extraocular Movement      Right Left    Full Full       Neuro/Psych    Oriented x3: Yes   Mood/Affect: Normal       Dilation    Right eye: 1.0% Mydriacyl, 2.5% Phenylephrine @ 7:54 AM        Slit Lamp and Fundus Exam    Slit Lamp Exam      Right Left   Lids/Lashes Dermatochalasis - upper lid Dermatochalasis - upper lid   Conjunctiva/Sclera Subconjunctival hemorrhage improving, sutures intact White and quiet   Cornea Descemet's folds, 6.5Vx5.0H mm epi defect with rolled edges (temporal half), 3+ Punctate epithelial erosions 1-2+Punctate epithelial erosions   Anterior Chamber clear, narrow angles Deep and quiet   Iris Round and dilated, No NVI Round and dilated, No NVI   Lens 2-3+ Nuclear sclerosis, 2-3+ Cortical cataract, 2+PC feathering / PSC 2-3+ Nuclear sclerosis, 2-3+ Cortical cataract   Vitreous post vitrectomy, good gas fill Vitreous syneresis, Posterior vitreous detachment       Fundus Exam      Right Left   Disc hazy view, perfused    C/D Ratio 0.2 0.2   Macula hazy view, attached under gas    Vessels Hazy view, Vascular attenuation, mild Tortuousity    Periphery Hazy view, retina attached over buckle, good buckle height, good laser over buckle 360; ORIGINALLY: Inferior detachment from 0200-1030, Subretinal band IT midzone, ?small tear at 1030         Refraction    Wearing Rx      Sphere Cylinder Axis Add   Right -0.50 +1.75 161 +2.25   Left -1.50 +1.00 002 +2.25   Type: PAL          IMAGING AND PROCEDURES  Imaging and Procedures for _0 @           ASSESSMENT/PLAN:    ICD-10-CM   1. Right retinal detachment  H33.21   2. Retinal edema  H35.81   3. Essential  hypertension  I10   4. Combined forms of age-related cataract of both eyes  H25.813   5. Hypertensive retinopathy of both eyes  H35.033     1,2. 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 - pt unsure of symptom onset  - detached inferiorly from 0200 to 1030, fovea off, ?Small tear at 1030 -- no obvious large tear  - now POW1 s/p SBP + PPV/PFC/EL/FAX/14% C3F8 OD, 03.18.21  - intra op - atrophic peripheral retina w/ microtears -- no frank/large tear             - retina attached and in good position -- good buckle height and laser around breaks  - today: temporal epi defect with rolled edges (6.5Vx5.0H)  - BCL placed: [employer] N&D BC: 8.6, DIA: 13.8, PWR: -0.25             - IOP okay at 16             - cont   PF 4x/day OD                          zymaxid QID OD                         Atropine BID OD -- stop when bottle runs  out                         Brimonidine BID OD                          PSO ung QID OD              - cont face down positioning 30 mins/hr; avoid laying flat on back              - eye shield when sleeping x1 more week             - post op drop and positioning instructions reviewed              - tylenol/ibuprofen for pain              - Rx given for breakthrough pain  - f/u 6 days, POV, K check  3,4. Hypertensive retinopathy OU  - discussed importance of tight BP control  - monitor  5. Mixed form age related cataracts OU  - The symptoms of cataract, surgical options, and treatments and risks were discussed with patient.  - discussed diagnosis and progression  - specifically discussed likelihood of progression of cataract OD following RD repair   Ophthalmic Meds Ordered this visit:  Meds ordered this encounter  Medications  . gatifloxacin (ZYMAXID) 0.5 % SOLN    Sig: Place 1 drop into the right eye 4 (four) times daily.    Dispense:  2.5 mL    Refill:  1  . prednisoLONE acetate (PRED FORTE) 1 % ophthalmic  suspension    Sig: Place 1 drop into the right eye 4 (four) times daily.    Dispense:  15 mL    Refill:  0  . brimonidine (ALPHAGAN) 0.2 % ophthalmic solution    Sig: Place 1 drop into the right eye in the morning and at bedtime.    Dispense:  15 mL    Refill:  1  . bacitracin-polymyxin b (POLYSPORIN) ophthalmic ointment    Sig: Place into the right eye at bedtime. Place a 1/2 inch ribbon of ointment into the lower eyelid.    Dispense:  3.5 g    Refill:  3       Return in about 6 days (around 12/08/2019) for f/u POV / K check OD.  There are no Patient Instructions on file for this visit.   Explained the diagnoses, plan, and follow up with the patient and they expressed understanding.  Patient expressed understanding of the importance of proper follow up care.   This document serves as a record of services personally performed by Gardiner Sleeper, MD, PhD. It was created on their behalf by Ernest Mallick, OA, an ophthalmic assistant. The creation of this record is the provider's dictation and/or activities during the visit.    Electronically signed by: Ernest Mallick, OA 03.25.2021 11:08 AM   Gardiner Sleeper, M.D., Ph.D. Diseases & Surgery of the Retina and Vitreous [facility]  I have reviewed the above documentation for accuracy and completeness, and I agree with the above. Gardiner Sleeper, M.D., Ph.D. 12/02/19 11:08 AM   Abbreviations: M myopia (nearsighted); A astigmatism; H hyperopia (farsighted); P presbyopia; Mrx spectacle prescription;  CTL contact lenses; OD right eye; OS left eye; OU both eyes  XT exotropia; ET esotropia; PEK punctate epithelial keratitis; PEE punctate epithelial erosions; DES dry  eye syndrome; MGD meibomian gland dysfunction; ATs artificial tears; PFAT's preservative free artificial tears; [place] nuclear sclerotic cataract; PSC posterior subcapsular cataract; ERM epi-retinal membrane; PVD posterior vitreous detachment; RD retinal  detachment; DM diabetes mellitus; DR diabetic retinopathy; NPDR non-proliferative diabetic retinopathy; PDR proliferative diabetic retinopathy; CSME clinically significant macular edema; DME diabetic macular edema; dbh dot blot hemorrhages; CWS cotton wool spot; POAG primary open angle glaucoma; C/D cup-to-disc ratio; HVF humphrey visual field; GVF goldmann visual field; OCT optical coherence tomography; IOP intraocular pressure; BRVO Branch retinal vein occlusion; CRVO central retinal vein occlusion; CRAO central retinal artery occlusion; BRAO branch retinal artery occlusion; RT retinal tear; SB scleral buckle; PPV pars plana vitrectomy; VH Vitreous hemorrhage; PRP panretinal laser photocoagulation; IVK intravitreal kenalog; VMT vitreomacular traction; MH Macular hole;  NVD neovascularization of the disc; NVE neovascularization elsewhere; AREDS age related eye disease study; ARMD age related macular degeneration; POAG primary open angle glaucoma; EBMD epithelial/anterior basement membrane dystrophy; ACIOL anterior chamber intraocular lens; IOL intraocular lens; PCIOL posterior chamber intraocular lens; Phaco/IOL phacoemulsification with intraocular lens placement; [place] photorefractive keratectomy; LASIK laser assisted in situ keratomileusis; HTN hypertension; DM diabetes mellitus; COPD chronic obstructive pulmonary disease

## 2019-12-08 ENCOUNTER — Ambulatory Visit (INDEPENDENT_AMBULATORY_CARE_PROVIDER_SITE_OTHER): Payer: Medicare PPO | Admitting: Ophthalmology

## 2019-12-08 ENCOUNTER — Encounter (INDEPENDENT_AMBULATORY_CARE_PROVIDER_SITE_OTHER): Payer: Self-pay | Admitting: Ophthalmology

## 2019-12-08 DIAGNOSIS — I10 Essential (primary) hypertension: Secondary | ICD-10-CM

## 2019-12-08 DIAGNOSIS — H3321 Serous retinal detachment, right eye: Secondary | ICD-10-CM

## 2019-12-08 DIAGNOSIS — H3581 Retinal edema: Secondary | ICD-10-CM

## 2019-12-08 DIAGNOSIS — H35033 Hypertensive retinopathy, bilateral: Secondary | ICD-10-CM

## 2019-12-08 DIAGNOSIS — H25813 Combined forms of age-related cataract, bilateral: Secondary | ICD-10-CM

## 2019-12-08 NOTE — Progress Notes (Signed)
[facility] Note  12/08/2019     CHIEF COMPLAINT Patient presents for Post-op Follow-up   HISTORY OF PRESENT ILLNESS: Mariah Cortez is a 69 y.o. female who presents to the clinic today for:   HPI    Post-op Follow-up    In right eye.  Discomfort includes itching and foreign body sensation.  Negative for pain, tearing, discharge, floaters and none.  Vision is blurred at distance and is blurred at near.  I, the attending physician,  performed the HPI with the patient and updated documentation appropriately.          Comments    Patient states OD feels scratchy at times. Lots of itching OD. Vision still blurred OD. Patient using gtts as instructed.        Last edited by [name], MD on 12/09/2019 12:14 PM. (History)    pt states she is trying to do half time face down but not quite getting half time face down.  Referring physician: [name], MD [facility] [place],  [place] [id number]  HISTORICAL INFORMATION:   Selected notes from the MEDICAL RECORD NUMBER Referred by Dr. Orion Modest for concern of mac off RD  LEE:  Ocular Hx- PMH-   CURRENT MEDICATIONS: Current Outpatient Medications (Ophthalmic Drugs)  Medication Sig  . bacitracin-polymyxin b (POLYSPORIN) ophthalmic ointment Place into the right eye at bedtime. Place a 1/2 inch ribbon of ointment into the lower eyelid. (Patient taking differently: Place 1 application into the right eye 4 (four) times daily. Place a 1/2 inch ribbon of ointment into the lower eyelid.)  . brimonidine (ALPHAGAN) 0.2 % ophthalmic solution Place 1 drop into the right eye in the morning and at bedtime.  [name] gatifloxacin (ZYMAXID) 0.5 % SOLN Place 1 drop into the right eye 4 (four) times daily.  . prednisoLONE acetate (PRED FORTE) 1 % ophthalmic suspension Place 1 drop into the right eye 4 (four) times daily.   No current facility-administered medications for this visit.  (Ophthalmic Drugs)   Current Outpatient Medications (Other)  Medication Sig  . APPLE CIDER VINEGAR PO Take 450 mg by mouth daily.  . Ascorbic Acid (VITAMIN C CR) 1000 MG TBCR Take 1,000 mg by mouth daily.   [name] aspirin 81 MG tablet Take 81 mg by mouth daily.    . cetirizine (ZYRTEC) 10 MG tablet Take 10 mg by mouth daily.    . Cholecalciferol (VITAMIN D3) 50 MCG (2000 UT) TABS Take 2,000 mg by mouth daily.  [name] esomeprazole (NEXIUM) 20 MG capsule Take 20 mg by mouth daily as needed (Heartburn).  . Ferrous Sulfate (IRON) 325 (65 FE) MG TABS Take 325 mg by mouth in the morning and at bedtime.   [name] glimepiride (AMARYL) 4 MG tablet Take 4 mg by mouth 2 (two) times daily.  . hydrochlorothiazide (HYDRODIURIL) 25 MG tablet Take 25 mg by mouth daily.  [name] HYDROcodone-acetaminophen (NORCO/VICODIN) 5-325 MG tablet Take 1 tablet by mouth every 4 (four) hours as needed for moderate pain.  [name] losartan (COZAAR) 50 MG tablet Take 50 mg by mouth daily.  [name] lovastatin (MEVACOR) 40 MG tablet Take 40 mg by mouth 2 (two) times daily.    . meloxicam (MOBIC) 7.5 MG tablet Take 7.5 mg by mouth daily.  . metFORMIN (GLUCOPHAGE) 1000 MG tablet Take 1,000 mg by mouth in the morning and at bedtime.   . Misc Natural Products (OSTEO BI-FLEX JOINT SHIELD PO) Take  1 tablet by mouth in the morning and at bedtime.   . Multiple Vitamin (MULTIVITAMIN) capsule Take 1 capsule by mouth daily.    . pantoprazole (PROTONIX) 40 MG tablet Take 40 mg by mouth daily.  . pioglitazone (ACTOS) 30 MG tablet Take 30 mg by mouth daily.  . Potassium 99 MG TABS Take 99 mg by mouth daily.  . traMADol (ULTRAM) 50 MG tablet Take 50 mg by mouth 2 (two) times daily. Additional  50 mg if needed  . vitamin B-12 (CYANOCOBALAMIN) 1000 MCG tablet Take 1,000 mcg by mouth daily.   No current facility-administered medications for this visit. (Other)      REVIEW OF SYSTEMS: ROS    Positive for: Endocrine, Eyes   Negative for: Constitutional,  Gastrointestinal, Neurological, Skin, Genitourinary, Musculoskeletal, HENT, Cardiovascular, Respiratory, Psychiatric, Allergic/Imm, Heme/Lymph   Last edited by Roselee Nova D, COT on 12/08/2019  9:17 AM. (History)       ALLERGIES Allergies  Allergen Reactions  . Latex Rash    Latex tape  . Morphine And Related Itching    PAST MEDICAL HISTORY Past Medical History:  Diagnosis Date  . Allergies   . Arthritis   . Cataracts, bilateral   . Diabetes mellitus   . GERD (gastroesophageal reflux disease)   . Headache   . Hyperlipidemia   . Hypertension   . Retinal detachment    right eye  . Stroke [facility])    " mild"  . Wears glasses    Past Surgical History:  Procedure Laterality Date  . COLONOSCOPY    . EYE SURGERY Right 11/25/2019   SBP+PPV for RD repair - Dr. [name]  . GAS/FLUID EXCHANGE Right 11/25/2019   Procedure: Gas/Fluid Exchange;  Surgeon: [name], MD;  Location: [facility];  Service: Ophthalmology;  Laterality: Right;  . JOINT REPLACEMENT     B/L  . LIPOMA RESECTION     Dr. [name]  . PHOTOCOAGULATION Right 11/25/2019   Procedure: Photocoagulation;  Surgeon: [name], MD;  Location: [facility];  Service: Ophthalmology;  Laterality: Right;  . REPLACEMENT TOTAL KNEE  2010   Right, Dr. [name]  . REPLACEMENT TOTAL KNEE  2013   left  . RETINAL DETACHMENT SURGERY Right 11/25/2019   SBP+PPV - Dr. [name]  . VITRECTOMY 25 GAUGE WITH SCLERAL BUCKLE Right 11/25/2019   Procedure: VITRECTOMY 25 GAUGE WITH SCLERAL BUCKLE;  Surgeon: [name], MD;  Location: [facility];  Service: Ophthalmology;  Laterality: Right;    FAMILY HISTORY Family History  Problem Relation Age of Onset  . Stroke Mother   . Hypertension Mother   . Diabetes Sister   . Arthritis Maternal Aunt     SOCIAL HISTORY Social History   Tobacco Use  . Smoking status: Never Smoker  . Smokeless tobacco: Never Used  Substance Use Topics  . Alcohol use: Yes    Comment: occassionaly  . Drug use:  Never         OPHTHALMIC EXAM:  Base Eye Exam    Visual Acuity (Snellen - Linear)      Right Left   Dist [place] HM    Dist cc  20/20   Dist ph [place] NI    Correction: Glasses       Tonometry (Tonopen, 9:24 AM)      Right Left   Pressure 14        Pupils      Dark Light Shape React APD   Right 6 6 Round Minimal None  Left 3 2 Round Slow None       Visual Fields (Counting fingers)      Left Right    Full    Restrictions  Total superior temporal, inferior temporal, superior nasal, inferior nasal deficiencies       Extraocular Movement      Right Left    Full, Ortho Full, Ortho       Neuro/Psych    Oriented x3: Yes   Mood/Affect: Normal       Dilation    Right eye: 1.0% Mydriacyl, 2.5% Phenylephrine @ 9:24 AM        Slit Lamp and Fundus Exam    Slit Lamp Exam      Right Left   Lids/Lashes Dermatochalasis - upper lid Dermatochalasis - upper lid   Conjunctiva/Sclera Subconjunctival hemorrhage improving, sutures intact White and quiet   Cornea BCL gone; 5.75Vx3.0H mm epi defect temp paracentral with rolled nasal edges , 3+ Punctate epithelial erosions,  1-2+Punctate epithelial erosions   Anterior Chamber Deep, clear Deep and quiet   Iris Round and dilated, No NVI Round and dilated, No NVI   Lens 2-3+ Nuclear sclerosis, 2-3+ Cortical cataract, 2-3+PC feathering / PSC 2-3+ Nuclear sclerosis, 2-3+ Cortical cataract   Vitreous post vitrectomy, good gas fill Vitreous syneresis, Posterior vitreous detachment       Fundus Exam      Right Left   Disc hazy view, perfused    C/D Ratio 0.2 0.2   Macula hazy view, grossly flat under gas    Vessels Hazy view, Vascular attenuation, mild Tortuousity    Periphery Hazy view, retina attached over buckle, good buckle height, good laser over buckle 360; ORIGINALLY: Inferior detachment from 0200-1030, Subretinal band IT midzone, ?small tear at 1030           IMAGING AND PROCEDURES  Imaging and Procedures for _0 @            ASSESSMENT/PLAN:    ICD-10-CM   1. Right retinal detachment  H33.21   2. Hypertensive retinopathy of both eyes  H35.033   3. Retinal edema  H35.81 CANCELED: OCT, Retina - OU - Both Eyes  4. Essential hypertension  I10   5. Combined forms of age-related cataract of both eyes  H25.813     1,2. 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 - pt unsure of symptom onset  - detached inferiorly from 0200 to 1030, fovea off, ?Small tear at 1030 -- no obvious large tear  - s/p SBP + PPV/PFC/EL/FAX/14% C3F8 OD, 03.18.21  - intra op - atrophic peripheral retina w/ microtears -- no frank/large tear             - retina attached and in good position -- good buckle height and laser around breaks  - today: temporal epi defect with rolled edges improving (5.75Vx3.0H), but BCL gone  - BCL replaced today 3.31.21: Administrator, arts N&D BC: 8.6, DIA: 13.8, PWR: -0.25             - IOP okay at 14             - cont   PF 4x/day OD                          zymaxid QID OD  Atropine BID OD -- stop when bottle runs out                         Brimonidine BID OD                          PSO ung QID OD              - cont face down positioning 30 mins/hr; avoid laying flat on back              - eye shield when sleeping x1 more week             - post op drop and positioning instructions reviewed              - tylenol/ibuprofen for pain              - Rx given for breakthrough pain  - f/u 2 days, POV, K check  3,4. Hypertensive retinopathy OU  - discussed importance of tight BP control  - monitor  5. Mixed form age related cataracts OU  - The symptoms of cataract, surgical options, and treatments and risks were discussed with patient.  - discussed diagnosis and progression  - specifically discussed likelihood of progression of cataract OD following RD repair   Ophthalmic Meds Ordered this visit:  No orders of the defined types were placed  in this encounter.      Return in about 2 days (around 12/10/2019) for Dilated exam, K check OD.  There are no Patient Instructions on file for this visit.   Explained the diagnoses, plan, and follow up with the patient and they expressed understanding.  Patient expressed understanding of the importance of proper follow up care.   This document serves as a record of services personally performed by Gardiner Sleeper, MD, PhD. It was created on their behalf by Leeann Must, [place], a certified ophthalmic assistant. The creation of this record is the provider's dictation and/or activities during the visit.    Electronically signed by: Leeann Must, COA _0 @ 12:16 PM   Gardiner Sleeper, M.D., Ph.D. Diseases & Surgery of the Retina and Vitreous [facility]  I have reviewed the above documentation for accuracy and completeness, and I agree with the above. Gardiner Sleeper, M.D., Ph.D. 12/09/19 12:16 PM   Abbreviations: M myopia (nearsighted); A astigmatism; H hyperopia (farsighted); P presbyopia; Mrx spectacle prescription;  CTL contact lenses; OD right eye; OS left eye; OU both eyes  XT exotropia; ET esotropia; PEK punctate epithelial keratitis; PEE punctate epithelial erosions; DES dry eye syndrome; MGD meibomian gland dysfunction; ATs artificial tears; PFAT's preservative free artificial tears; [place] nuclear sclerotic cataract; PSC posterior subcapsular cataract; ERM epi-retinal membrane; PVD posterior vitreous detachment; RD retinal detachment; DM diabetes mellitus; DR diabetic retinopathy; NPDR non-proliferative diabetic retinopathy; PDR proliferative diabetic retinopathy; CSME clinically significant macular edema; DME diabetic macular edema; dbh dot blot hemorrhages; CWS cotton wool spot; POAG primary open angle glaucoma; C/D cup-to-disc ratio; HVF humphrey visual field; GVF goldmann visual field; OCT optical coherence tomography; IOP intraocular pressure; BRVO Branch  retinal vein occlusion; CRVO central retinal vein occlusion; CRAO central retinal artery occlusion; BRAO branch retinal artery occlusion; RT retinal tear; SB scleral buckle; PPV pars plana vitrectomy; VH Vitreous hemorrhage; PRP panretinal laser photocoagulation; IVK intravitreal kenalog; VMT vitreomacular traction; MH Macular hole;  NVD neovascularization of the disc; NVE neovascularization  elsewhere; AREDS age related eye disease study; ARMD age related macular degeneration; POAG primary open angle glaucoma; EBMD epithelial/anterior basement membrane dystrophy; ACIOL anterior chamber intraocular lens; IOL intraocular lens; PCIOL posterior chamber intraocular lens; Phaco/IOL phacoemulsification with intraocular lens placement; [place] photorefractive keratectomy; LASIK laser assisted in situ keratomileusis; HTN hypertension; DM diabetes mellitus; COPD chronic obstructive pulmonary disease

## 2019-12-09 ENCOUNTER — Encounter (INDEPENDENT_AMBULATORY_CARE_PROVIDER_SITE_OTHER): Payer: Self-pay | Admitting: Ophthalmology

## 2019-12-09 NOTE — Progress Notes (Signed)
[facility] Note  12/10/2019     CHIEF COMPLAINT Patient presents for Retina Follow Up   HISTORY OF PRESENT ILLNESS: Mariah Cortez is a 69 y.o. female who presents to the clinic today for:   HPI    Retina Follow Up    Patient presents with  Other.  In right eye.  This started 2 days ago.  Severity is moderate.  I, the attending physician,  performed the HPI with the patient and updated documentation appropriately.          Comments    Patient here for 2 days retina follow up for dilated exam. Patient states vision doing pretty good. Wearing CL- hope still in eye. Has a little bit eye pain. Has a lot of itching. Felt some kind of scratchy.       Last edited by [name], MD on 12/10/2019  9:45 AM. (History)    pt states she is trying to do half time face down but not quite getting half time face down.  Referring physician: [name], MD [facility] [place],  [place] [address]  HISTORICAL INFORMATION:   Selected notes from the MEDICAL RECORD NUMBER Referred by Dr. Orion Modest for concern of mac off RD  LEE:  Ocular Hx- PMH-   CURRENT MEDICATIONS: Current Outpatient Medications (Ophthalmic Drugs)  Medication Sig  . bacitracin-polymyxin b (POLYSPORIN) ophthalmic ointment Place into the right eye at bedtime. Place a 1/2 inch ribbon of ointment into the lower eyelid. (Patient taking differently: Place 1 application into the right eye 4 (four) times daily. Place a 1/2 inch ribbon of ointment into the lower eyelid.)  . brimonidine (ALPHAGAN) 0.2 % ophthalmic solution Place 1 drop into the right eye in the morning and at bedtime.  [name] gatifloxacin (ZYMAXID) 0.5 % SOLN Place 1 drop into the right eye 4 (four) times daily.  . prednisoLONE acetate (PRED FORTE) 1 % ophthalmic suspension Place 1 drop into the right eye 4 (four) times daily.   No current facility-administered medications for this visit. (Ophthalmic  Drugs)   Current Outpatient Medications (Other)  Medication Sig  . APPLE CIDER VINEGAR PO Take 450 mg by mouth daily.  . Ascorbic Acid (VITAMIN C CR) 1000 MG TBCR Take 1,000 mg by mouth daily.   [name] aspirin 81 MG tablet Take 81 mg by mouth daily.    . cetirizine (ZYRTEC) 10 MG tablet Take 10 mg by mouth daily.    . Cholecalciferol (VITAMIN D3) 50 MCG (2000 UT) TABS Take 2,000 mg by mouth daily.  [name] esomeprazole (NEXIUM) 20 MG capsule Take 20 mg by mouth daily as needed (Heartburn).  . Ferrous Sulfate (IRON) 325 (65 FE) MG TABS Take 325 mg by mouth in the morning and at bedtime.   [name] glimepiride (AMARYL) 4 MG tablet Take 4 mg by mouth 2 (two) times daily.  . hydrochlorothiazide (HYDRODIURIL) 25 MG tablet Take 25 mg by mouth daily.  [name] HYDROcodone-acetaminophen (NORCO/VICODIN) 5-325 MG tablet Take 1 tablet by mouth every 4 (four) hours as needed for moderate pain.  [name] losartan (COZAAR) 50 MG tablet Take 50 mg by mouth daily.  [name] lovastatin (MEVACOR) 40 MG tablet Take 40 mg by mouth 2 (two) times daily.    . meloxicam (MOBIC) 7.5 MG tablet Take 7.5 mg by mouth daily.  . metFORMIN (GLUCOPHAGE) 1000 MG tablet Take 1,000 mg by mouth in the morning and at bedtime.   [name]  Misc Natural Products (OSTEO BI-FLEX JOINT SHIELD PO) Take 1 tablet by mouth in the morning and at bedtime.   . Multiple Vitamin (MULTIVITAMIN) capsule Take 1 capsule by mouth daily.    . pantoprazole (PROTONIX) 40 MG tablet Take 40 mg by mouth daily.  . pioglitazone (ACTOS) 30 MG tablet Take 30 mg by mouth daily.  . Potassium 99 MG TABS Take 99 mg by mouth daily.  . traMADol (ULTRAM) 50 MG tablet Take 50 mg by mouth 2 (two) times daily. Additional  50 mg if needed  . vitamin B-12 (CYANOCOBALAMIN) 1000 MCG tablet Take 1,000 mcg by mouth daily.   No current facility-administered medications for this visit. (Other)      REVIEW OF SYSTEMS: ROS    Positive for: Endocrine, Eyes   Negative for: Constitutional, Gastrointestinal,  Neurological, Skin, Genitourinary, Musculoskeletal, HENT, Cardiovascular, Respiratory, Psychiatric, Allergic/Imm, Heme/Lymph   Last edited by Theodore Demark, COA on 12/10/2019  8:53 AM. (History)       ALLERGIES Allergies  Allergen Reactions  . Latex Rash    Latex tape  . Morphine And Related Itching    PAST MEDICAL HISTORY Past Medical History:  Diagnosis Date  . Allergies   . Arthritis   . Cataracts, bilateral   . Diabetes mellitus   . GERD (gastroesophageal reflux disease)   . Headache   . Hyperlipidemia   . Hypertension   . Retinal detachment    right eye  . Stroke [facility])    " mild"  . Wears glasses    Past Surgical History:  Procedure Laterality Date  . COLONOSCOPY    . EYE SURGERY Right 11/25/2019   SBP+PPV for RD repair - Dr. [name]  . GAS/FLUID EXCHANGE Right 11/25/2019   Procedure: Gas/Fluid Exchange;  Surgeon: [name], MD;  Location: [place];  Service: Ophthalmology;  Laterality: Right;  . JOINT REPLACEMENT     B/L  . LIPOMA RESECTION     Dr. [name]  . PHOTOCOAGULATION Right 11/25/2019   Procedure: Photocoagulation;  Surgeon: [name], MD;  Location: [place];  Service: Ophthalmology;  Laterality: Right;  . REPLACEMENT TOTAL KNEE  2010   Right, Dr. [name]  . REPLACEMENT TOTAL KNEE  2013   left  . RETINAL DETACHMENT SURGERY Right 11/25/2019   SBP+PPV - Dr. [name]  . VITRECTOMY 25 GAUGE WITH SCLERAL BUCKLE Right 11/25/2019   Procedure: VITRECTOMY 25 GAUGE WITH SCLERAL BUCKLE;  Surgeon: [name], MD;  Location: [facility];  Service: Ophthalmology;  Laterality: Right;    FAMILY HISTORY Family History  Problem Relation Age of Onset  . Stroke Mother   . Hypertension Mother   . Diabetes Sister   . Arthritis Maternal Aunt     SOCIAL HISTORY Social History   Tobacco Use  . Smoking status: Never Smoker  . Smokeless tobacco: Never Used  Substance Use Topics  . Alcohol use: Yes    Comment: occassionaly  . Drug use: Never          OPHTHALMIC EXAM:  Base Eye Exam    Visual Acuity (Snellen - Linear)      Right Left   Dist cc HM 20/20 -1       Tonometry (Tonopen, 8:46 AM)      Right Left   Pressure 9        Pupils      Dark Light Shape React APD   Right        Left 3 2 Round Slow  None       Visual Fields (Counting fingers)      Left Right    Full    Restrictions  Total superior temporal, inferior temporal, superior nasal, inferior nasal deficiencies       Extraocular Movement      Right Left    Full Full       Neuro/Psych    Oriented x3: Yes   Mood/Affect: Normal       Dilation    Right eye: 1.0% Mydriacyl, 2.5% Phenylephrine @ 8:46 AM        Slit Lamp and Fundus Exam    Slit Lamp Exam      Right Left   Lids/Lashes Dermatochalasis - upper lid Dermatochalasis - upper lid   Conjunctiva/Sclera Subconjunctival hemorrhage improving, sutures intact White and quiet   Cornea BCL in place; 4.75Vx2.5H mm epi defect temp paracentral with rolled nasal edges--improved , 2-3+ Punctate epithelial erosions,  1-2+Punctate epithelial erosions   Anterior Chamber Deep, clear Deep and quiet   Iris Round and dilated, No NVI Round and dilated, No NVI   Lens 2-3+ Nuclear sclerosis, 2-3+ Cortical cataract, 2-3+PC feathering / PSC 2-3+ Nuclear sclerosis, 2-3+ Cortical cataract   Vitreous post vitrectomy, good gas fill Vitreous syneresis, Posterior vitreous detachment       Fundus Exam      Right Left   Disc hazy view, perfused    C/D Ratio 0.2 0.2   Macula hazy view, grossly flat under gas    Vessels Hazy view, Vascular attenuation, mild Tortuousity    Periphery Hazy view, retina attached over buckle, good buckle height, good laser over buckle 360; ORIGINALLY: Inferior detachment from 0200-1030, Subretinal band IT midzone, ?small tear at 1030         Refraction    Wearing Rx      Sphere Cylinder Axis Add   Right -0.50 +1.75 161 +2.25   Left -1.50 +1.00 002 +2.25   Type: PAL          IMAGING AND  PROCEDURES  Imaging and Procedures for _0 @           ASSESSMENT/PLAN:    ICD-10-CM   1. Right retinal detachment  H33.21   2. Hypertensive retinopathy of both eyes  H35.033   3. Retinal edema  H35.81   4. Essential hypertension  I10   5. Combined forms of age-related cataract of both eyes  H25.813     1,2. 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 - pt unsure of symptom onset  - detached inferiorly from 0200 to 1030, fovea off, ?Small tear at 1030 -- no obvious large tear  - s/p SBP + PPV/PFC/EL/FAX/14% C3F8 OD, 03.18.21  - intra op - atrophic peripheral retina w/ microtears -- no frank/large tear             - retina attached and in good position -- good buckle height and laser around breaks  - BCL knocked out and replaced 3.31.21: Administrator, arts N&D BC: 8.6, DIA: 13.8, PWR: -0.25  - today: BCL in place and temporal epi defect with rolled edges improving (4.75Vx2.5H)             - IOP okay at 9             - cont   PF 4x/day OD  zymaxid QID OD                         Atropine BID OD -- stop when bottle runs out                         Brimonidine BID OD                          PSO ung QID OD              - cont face down positioning 30 mins/hr; avoid laying flat on back              - eye shield when sleeping x1 more week             - post op drop and positioning instructions reviewed              - tylenol/ibuprofen for pain              - Rx given for breakthrough pain  - f/u 1 week, POV, K check  3,4. Hypertensive retinopathy OU  - discussed importance of tight BP control  - monitor  5. Mixed form age related cataracts OU  - The symptoms of cataract, surgical options, and treatments and risks were discussed with patient.  - discussed diagnosis and progression  - specifically discussed likelihood of progression of cataract OD following RD repair   Ophthalmic Meds Ordered this visit:  No orders  of the defined types were placed in this encounter.      Return in about 1 week (around 12/17/2019) for Dilated exam/OCT/K check OD POV.  There are no Patient Instructions on file for this visit.   Explained the diagnoses, plan, and follow up with the patient and they expressed understanding.  Patient expressed understanding of the importance of proper follow up care.   This document serves as a record of services personally performed by Gardiner Sleeper, MD, PhD. It was created on their behalf by Ernest Mallick, OA, an ophthalmic assistant. The creation of this record is the provider's dictation and/or activities during the visit.    Electronically signed by: Ernest Mallick, OA 04.01.2021 12:57 PM   Gardiner Sleeper, M.D., Ph.D. Diseases & Surgery of the Retina and Vitreous [facility]  I have reviewed the above documentation for accuracy and completeness, and I agree with the above. Gardiner Sleeper, M.D., Ph.D. 12/10/19 12:57 PM   Abbreviations: M myopia (nearsighted); A astigmatism; H hyperopia (farsighted); P presbyopia; Mrx spectacle prescription;  CTL contact lenses; OD right eye; OS left eye; OU both eyes  XT exotropia; ET esotropia; PEK punctate epithelial keratitis; PEE punctate epithelial erosions; DES dry eye syndrome; MGD meibomian gland dysfunction; ATs artificial tears; PFAT's preservative free artificial tears; [place] nuclear sclerotic cataract; PSC posterior subcapsular cataract; ERM epi-retinal membrane; PVD posterior vitreous detachment; RD retinal detachment; DM diabetes mellitus; DR diabetic retinopathy; NPDR non-proliferative diabetic retinopathy; PDR proliferative diabetic retinopathy; CSME clinically significant macular edema; DME diabetic macular edema; dbh dot blot hemorrhages; CWS cotton wool spot; POAG primary open angle glaucoma; C/D cup-to-disc ratio; HVF humphrey visual field; GVF goldmann visual field; OCT optical coherence tomography; IOP  intraocular pressure; BRVO Branch retinal vein occlusion; CRVO central retinal vein occlusion; CRAO central retinal artery occlusion; BRAO branch retinal artery occlusion; RT retinal tear; SB scleral buckle; PPV pars  plana vitrectomy; VH Vitreous hemorrhage; PRP panretinal laser photocoagulation; IVK intravitreal kenalog; VMT vitreomacular traction; MH Macular hole;  NVD neovascularization of the disc; NVE neovascularization elsewhere; AREDS age related eye disease study; ARMD age related macular degeneration; POAG primary open angle glaucoma; EBMD epithelial/anterior basement membrane dystrophy; ACIOL anterior chamber intraocular lens; IOL intraocular lens; PCIOL posterior chamber intraocular lens; Phaco/IOL phacoemulsification with intraocular lens placement; [place] photorefractive keratectomy; LASIK laser assisted in situ keratomileusis; HTN hypertension; DM diabetes mellitus; COPD chronic obstructive pulmonary disease

## 2019-12-10 ENCOUNTER — Ambulatory Visit (INDEPENDENT_AMBULATORY_CARE_PROVIDER_SITE_OTHER): Payer: Medicare PPO | Admitting: Ophthalmology

## 2019-12-10 ENCOUNTER — Encounter (INDEPENDENT_AMBULATORY_CARE_PROVIDER_SITE_OTHER): Payer: Self-pay | Admitting: Ophthalmology

## 2019-12-10 DIAGNOSIS — H3581 Retinal edema: Secondary | ICD-10-CM

## 2019-12-10 DIAGNOSIS — I10 Essential (primary) hypertension: Secondary | ICD-10-CM

## 2019-12-10 DIAGNOSIS — H25813 Combined forms of age-related cataract, bilateral: Secondary | ICD-10-CM

## 2019-12-10 DIAGNOSIS — H35033 Hypertensive retinopathy, bilateral: Secondary | ICD-10-CM

## 2019-12-10 DIAGNOSIS — H3321 Serous retinal detachment, right eye: Secondary | ICD-10-CM

## 2019-12-13 NOTE — Progress Notes (Signed)
[facility] Note  12/16/2019     CHIEF COMPLAINT Patient presents for Retina Follow Up   HISTORY OF PRESENT ILLNESS: Mariah Cortez is a 69 y.o. female who presents to the clinic today for:   HPI    Retina Follow Up    Patient presents with  Retinal Break/Detachment.  In right eye.  This started 1 week ago.  Severity is moderate.  I, the attending physician,  performed the HPI with the patient and updated documentation appropriately.          Comments    Patient here for 1 week retina follow up for RD OD/ K check OD. Patient wearing bandage CL OD. Patient states vision can't tell bubble in OD. Used gtts this am. Has been having acid reflux since had sx. Has a little eye pain, not much.        Last edited by [name], MD on 12/16/2019  9:42 AM. (History)    pt states she has had acid reflex ever since surgery, she states cough drops help, she is using ointment at night only  Referring physician: [name], MD [place],  [place] [address]  HISTORICAL INFORMATION:   Selected notes from the MEDICAL RECORD NUMBER Referred by Dr. Orion Modest for concern of mac off RD  LEE:  Ocular Hx- PMH-   CURRENT MEDICATIONS: Current Outpatient Medications (Ophthalmic Drugs)  Medication Sig  . bacitracin-polymyxin b (POLYSPORIN) ophthalmic ointment Place into the right eye at bedtime. Place a 1/2 inch ribbon of ointment into the lower eyelid. (Patient taking differently: Place 1 application into the right eye 4 (four) times daily. Place a 1/2 inch ribbon of ointment into the lower eyelid.)  . brimonidine (ALPHAGAN) 0.2 % ophthalmic solution Place 1 drop into the right eye in the morning and at bedtime.  [name] gatifloxacin (ZYMAXID) 0.5 % SOLN Place 1 drop into the right eye 4 (four) times daily.  . prednisoLONE acetate (PRED FORTE) 1 % ophthalmic suspension Place 1 drop into the right eye 4 (four) times daily.   No  current facility-administered medications for this visit. (Ophthalmic Drugs)   Current Outpatient Medications (Other)  Medication Sig  . APPLE CIDER VINEGAR PO Take 450 mg by mouth daily.  . Ascorbic Acid (VITAMIN C CR) 1000 MG TBCR Take 1,000 mg by mouth daily.   [name] aspirin 81 MG tablet Take 81 mg by mouth daily.    . cetirizine (ZYRTEC) 10 MG tablet Take 10 mg by mouth daily.    . Cholecalciferol (VITAMIN D3) 50 MCG (2000 UT) TABS Take 2,000 mg by mouth daily.  [name] esomeprazole (NEXIUM) 20 MG capsule Take 20 mg by mouth daily as needed (Heartburn).  . Ferrous Sulfate (IRON) 325 (65 FE) MG TABS Take 325 mg by mouth in the morning and at bedtime.   [name] glimepiride (AMARYL) 4 MG tablet Take 4 mg by mouth 2 (two) times daily.  . hydrochlorothiazide (HYDRODIURIL) 25 MG tablet Take 25 mg by mouth daily.  [name] HYDROcodone-acetaminophen (NORCO/VICODIN) 5-325 MG tablet Take 1 tablet by mouth every 4 (four) hours as needed for moderate pain.  [name] losartan (COZAAR) 50 MG tablet Take 50 mg by mouth daily.  [name] lovastatin (MEVACOR) 40 MG tablet Take 40 mg by mouth 2 (two) times daily.    . meloxicam (MOBIC) 7.5 MG tablet Take 7.5 mg by mouth daily.  . metFORMIN (GLUCOPHAGE) 1000 MG tablet Take 1,000  mg by mouth in the morning and at bedtime.   . Misc Natural Products (OSTEO BI-FLEX JOINT SHIELD PO) Take 1 tablet by mouth in the morning and at bedtime.   . Multiple Vitamin (MULTIVITAMIN) capsule Take 1 capsule by mouth daily.    . pantoprazole (PROTONIX) 40 MG tablet Take 40 mg by mouth daily.  . pioglitazone (ACTOS) 30 MG tablet Take 30 mg by mouth daily.  . Potassium 99 MG TABS Take 99 mg by mouth daily.  . traMADol (ULTRAM) 50 MG tablet Take 50 mg by mouth 2 (two) times daily. Additional  50 mg if needed  . vitamin B-12 (CYANOCOBALAMIN) 1000 MCG tablet Take 1,000 mcg by mouth daily.   No current facility-administered medications for this visit. (Other)      REVIEW OF SYSTEMS: ROS    Positive for:  Endocrine, Eyes   Negative for: Constitutional, Gastrointestinal, Neurological, Skin, Genitourinary, Musculoskeletal, HENT, Cardiovascular, Respiratory, Psychiatric, Allergic/Imm, Heme/Lymph   Last edited by Theodore Demark, COA on 12/16/2019  9:15 AM. (History)       ALLERGIES Allergies  Allergen Reactions  . Latex Rash    Latex tape  . Morphine And Related Itching    PAST MEDICAL HISTORY Past Medical History:  Diagnosis Date  . Allergies   . Arthritis   . Cataracts, bilateral   . Diabetes mellitus   . GERD (gastroesophageal reflux disease)   . Headache   . Hyperlipidemia   . Hypertension   . Retinal detachment    right eye  . Stroke [facility])    " mild"  . Wears glasses    Past Surgical History:  Procedure Laterality Date  . COLONOSCOPY    . EYE SURGERY Right 11/25/2019   SBP+PPV for RD repair - Dr. [name]  . GAS/FLUID EXCHANGE Right 11/25/2019   Procedure: Gas/Fluid Exchange;  Surgeon: [name], MD;  Location: [place];  Service: Ophthalmology;  Laterality: Right;  . JOINT REPLACEMENT     B/L  . LIPOMA RESECTION     Dr. [name]  . PHOTOCOAGULATION Right 11/25/2019   Procedure: Photocoagulation;  Surgeon: [name], MD;  Location: [place];  Service: Ophthalmology;  Laterality: Right;  . REPLACEMENT TOTAL KNEE  2010   Right, Dr. [name]  . REPLACEMENT TOTAL KNEE  2013   left  . RETINAL DETACHMENT SURGERY Right 11/25/2019   SBP+PPV - Dr. [name]  . VITRECTOMY 25 GAUGE WITH SCLERAL BUCKLE Right 11/25/2019   Procedure: VITRECTOMY 25 GAUGE WITH SCLERAL BUCKLE;  Surgeon: [name], MD;  Location: [place];  Service: Ophthalmology;  Laterality: Right;    FAMILY HISTORY Family History  Problem Relation Age of Onset  . Stroke Mother   . Hypertension Mother   . Diabetes Sister   . Arthritis Maternal Aunt     SOCIAL HISTORY Social History   Tobacco Use  . Smoking status: Never Smoker  . Smokeless tobacco: Never Used  Substance Use Topics  . Alcohol  use: Yes    Comment: occassionaly  . Drug use: Never         OPHTHALMIC EXAM:  Base Eye Exam    Visual Acuity (Snellen - Linear)      Right Left   Dist cc CF 1' 20/20 -1   Dist ph cc NI    Correction: Glasses       Tonometry (Tonopen, 9:12 AM)      Right Left   Pressure 17 def       Pupils  Dark Light Shape React APD   Right        Left 3 2 Round Brisk None       Visual Fields      Left Right    Full    Restrictions  Total superior temporal, inferior temporal, superior nasal, inferior nasal deficiencies       Extraocular Movement      Right Left    Full, Ortho Full, Ortho       Neuro/Psych    Oriented x3: Yes   Mood/Affect: Normal       Dilation    Right eye: 1.0% Mydriacyl, 2.5% Phenylephrine @ 9:12 AM        Slit Lamp and Fundus Exam    Slit Lamp Exam      Right Left   Lids/Lashes Dermatochalasis - upper lid Dermatochalasis - upper lid   Conjunctiva/Sclera 1+ injection; sutures intact White and quiet   Cornea BCL in place; 3.0Vx1.0H mm epi defect temp paracentral with rolled nasal edges--improving, 2-3+ Punctate epithelial erosions, irregular surface, mild corneal haze 1-2+Punctate epithelial erosions   Anterior Chamber Deep, 1+ Cell/pigment Deep and quiet   Iris Round and dilated, No NVI Round and dilated, No NVI   Lens 2-3+ Nuclear sclerosis, 2-3+ Cortical cataract, 2-3+PSC 2-3+ Nuclear sclerosis, 2-3+ Cortical cataract   Vitreous post vitrectomy, ~70% gas bubble Vitreous syneresis, Posterior vitreous detachment       Fundus Exam      Right Left   Disc hazy view, perfused    C/D Ratio 0.2 0.2   Macula hazy view, grossly flat under gas    Vessels Hazy view, Vascular attenuation, mild Tortuousity    Periphery Hazy view, retina attached over buckle, good buckle height, good laser over buckle 360; ORIGINALLY: Inferior detachment from 0200-1030, Subretinal band IT midzone, ?small tear at 1030         Refraction    Wearing Rx      Sphere  Cylinder Axis Add   Right -0.50 +1.75 161 +2.25   Left -1.50 +1.00 002 +2.25   Type: PAL          IMAGING AND PROCEDURES  Imaging and Procedures for _0 @           ASSESSMENT/PLAN:    ICD-10-CM   1. Right retinal detachment  H33.21   2. Hypertensive retinopathy of both eyes  H35.033   3. Retinal edema  H35.81   4. Essential hypertension  I10   5. Combined forms of age-related cataract of both eyes  H25.813     1,2. 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 - pt unsure of symptom onset  - detached inferiorly from 0200 to 1030, fovea off, ?Small tear at 1030 -- no obvious large tear  - s/p SBP + PPV/PFC/EL/FAX/14% C3F8 OD, 03.18.21  - intra op - atrophic peripheral retina w/ microtears -- no frank/large tear             - retina attached and in good position -- good buckle height and laser around breaks  - BCL knocked out and replaced 3.31.21: Administrator, arts N&D BC: 8.6, DIA: 13.8, PWR: -0.25  - today: BCL in place and temporal paracentral epi defect with rolled edges improving (3.0Vx1.0H)             - IOP okay at 17  - gas bubble at 70%             -  cont   PF 4x/day OD                          zymaxid QID OD                         Brimonidine BID OD                          PSO ung QID OD              - cont face down positioning 30 mins/hr; avoid laying flat on back              - post op drop and positioning instructions reviewed              - tylenol/ibuprofen for pain              - Rx given for breakthrough pain  - f/u 1 week, POV, K check  3,4. Hypertensive retinopathy OU  - discussed importance of tight BP control  - monitor  5. Mixed form age related cataracts OU  - The symptoms of cataract, surgical options, and treatments and risks were discussed with patient.  - discussed diagnosis and progression  - specifically discussed likelihood of progression of cataract OD following RD repair   Ophthalmic Meds  Ordered this visit:  No orders of the defined types were placed in this encounter.      Return in about 1 week (around 12/23/2019) for f/u RD OD, DFE.  There are no Patient Instructions on file for this visit.   Explained the diagnoses, plan, and follow up with the patient and they expressed understanding.  Patient expressed understanding of the importance of proper follow up care.   This document serves as a record of services personally performed by Gardiner Sleeper, MD, PhD. It was created on their behalf by Leeann Must, [place], a certified ophthalmic assistant. The creation of this record is the provider's dictation and/or activities during the visit.    Electronically signed by: Leeann Must, COA _0 @ 10:15 AM  Gardiner Sleeper, M.D., Ph.D. Diseases & Surgery of the Retina and Vitreous [facility]  I have reviewed the above documentation for accuracy and completeness, and I agree with the above. Gardiner Sleeper, M.D., Ph.D. 12/16/19 10:15 AM   Abbreviations: M myopia (nearsighted); A astigmatism; H hyperopia (farsighted); P presbyopia; Mrx spectacle prescription;  CTL contact lenses; OD right eye; OS left eye; OU both eyes  XT exotropia; ET esotropia; PEK punctate epithelial keratitis; PEE punctate epithelial erosions; DES dry eye syndrome; MGD meibomian gland dysfunction; ATs artificial tears; PFAT's preservative free artificial tears; [place] nuclear sclerotic cataract; PSC posterior subcapsular cataract; ERM epi-retinal membrane; PVD posterior vitreous detachment; RD retinal detachment; DM diabetes mellitus; DR diabetic retinopathy; NPDR non-proliferative diabetic retinopathy; PDR proliferative diabetic retinopathy; CSME clinically significant macular edema; DME diabetic macular edema; dbh dot blot hemorrhages; CWS cotton wool spot; POAG primary open angle glaucoma; C/D cup-to-disc ratio; HVF humphrey visual field; GVF goldmann visual field; OCT optical coherence  tomography; IOP intraocular pressure; BRVO Branch retinal vein occlusion; CRVO central retinal vein occlusion; CRAO central retinal artery occlusion; BRAO branch retinal artery occlusion; RT retinal tear; SB scleral buckle; PPV pars plana vitrectomy; VH Vitreous hemorrhage; PRP panretinal laser photocoagulation; IVK intravitreal kenalog; VMT vitreomacular traction; MH Macular hole;  NVD neovascularization of the  disc; NVE neovascularization elsewhere; AREDS age related eye disease study; ARMD age related macular degeneration; POAG primary open angle glaucoma; EBMD epithelial/anterior basement membrane dystrophy; ACIOL anterior chamber intraocular lens; IOL intraocular lens; PCIOL posterior chamber intraocular lens; Phaco/IOL phacoemulsification with intraocular lens placement; [place] photorefractive keratectomy; LASIK laser assisted in situ keratomileusis; HTN hypertension; DM diabetes mellitus; COPD chronic obstructive pulmonary disease

## 2019-12-16 ENCOUNTER — Ambulatory Visit (INDEPENDENT_AMBULATORY_CARE_PROVIDER_SITE_OTHER): Payer: Medicare PPO | Admitting: Ophthalmology

## 2019-12-16 ENCOUNTER — Encounter (INDEPENDENT_AMBULATORY_CARE_PROVIDER_SITE_OTHER): Payer: Self-pay | Admitting: Ophthalmology

## 2019-12-16 DIAGNOSIS — H35033 Hypertensive retinopathy, bilateral: Secondary | ICD-10-CM

## 2019-12-16 DIAGNOSIS — H3321 Serous retinal detachment, right eye: Secondary | ICD-10-CM

## 2019-12-16 DIAGNOSIS — H25813 Combined forms of age-related cataract, bilateral: Secondary | ICD-10-CM

## 2019-12-16 DIAGNOSIS — H3581 Retinal edema: Secondary | ICD-10-CM

## 2019-12-16 DIAGNOSIS — I10 Essential (primary) hypertension: Secondary | ICD-10-CM

## 2019-12-21 NOTE — Progress Notes (Signed)
[facility] Note  12/23/2019     CHIEF COMPLAINT Patient presents for Post-op Follow-up   HISTORY OF PRESENT ILLNESS: Mariah Cortez is a 69 y.o. female who presents to the clinic today for:   HPI    Post-op Follow-up    In right eye.  Vision is improved.  I, the attending physician,  performed the HPI with the patient and updated documentation appropriately.          Comments    Pt here for 1 week POV / K check, pt reports her eye is doing "pretty good", she feels like her vision has gotten better since last week, she is wearing the eye patch so she doesn't scratch her eye, she has a "little bit" of eye pain, BCL appears to be in place, she is using brim bid, PF / gati QID       Last edited by [name], MD on 12/23/2019  8:20 PM. (History)    pt states she feels like her vision has improved   Referring physician: [name], MD [address] [place],  [place] [address]  HISTORICAL INFORMATION:   Selected notes from the MEDICAL RECORD NUMBER Referred by Dr. Orion Modest for concern of mac off RD  LEE:  Ocular Hx- PMH-   CURRENT MEDICATIONS: Current Outpatient Medications (Ophthalmic Drugs)  Medication Sig  . bacitracin-polymyxin b (POLYSPORIN) ophthalmic ointment Place into the right eye at bedtime. Place a 1/2 inch ribbon of ointment into the lower eyelid. (Patient taking differently: Place 1 application into the right eye 4 (four) times daily. Place a 1/2 inch ribbon of ointment into the lower eyelid.)  . brimonidine (ALPHAGAN) 0.2 % ophthalmic solution Place 1 drop into the right eye in the morning and at bedtime.  [name] gatifloxacin (ZYMAXID) 0.5 % SOLN Place 1 drop into the right eye 4 (four) times daily.  . prednisoLONE acetate (PRED FORTE) 1 % ophthalmic suspension Place 1 drop into the right eye 4 (four) times daily.   No current facility-administered medications for this visit. (Ophthalmic Drugs)    Current Outpatient Medications (Other)  Medication Sig  . Cysteamine Bitartrate (PROCYSBI) 300 MG PACK by XX route as directed  . APPLE CIDER VINEGAR PO Take 450 mg by mouth daily.  . Ascorbic Acid (VITAMIN C CR) 1000 MG TBCR Take 1,000 mg by mouth daily.   [name] aspirin 81 MG tablet Take 81 mg by mouth daily.    . cetirizine (ZYRTEC) 10 MG tablet Take 10 mg by mouth daily.    . Cholecalciferol (VITAMIN D3) 50 MCG (2000 UT) TABS Take 2,000 mg by mouth daily.  [name] esomeprazole (NEXIUM) 20 MG capsule Take 20 mg by mouth daily as needed (Heartburn).  . Ferrous Sulfate (IRON) 325 (65 FE) MG TABS Take 325 mg by mouth in the morning and at bedtime.   [name] glimepiride (AMARYL) 4 MG tablet Take 4 mg by mouth 2 (two) times daily.  . hydrochlorothiazide (HYDRODIURIL) 25 MG tablet Take 25 mg by mouth daily.  [name] HYDROcodone-acetaminophen (NORCO/VICODIN) 5-325 MG tablet Take 1 tablet by mouth every 4 (four) hours as needed for moderate pain.  [name] losartan (COZAAR) 50 MG tablet Take 50 mg by mouth daily.  [name] lovastatin (MEVACOR) 40 MG tablet Take 40 mg by mouth 2 (two) times daily.    . meloxicam (MOBIC) 7.5 MG tablet Take 7.5 mg by mouth daily.  . metFORMIN (GLUCOPHAGE) 1000 MG tablet  Take 1,000 mg by mouth in the morning and at bedtime.   . Misc Natural Products (OSTEO BI-FLEX JOINT SHIELD PO) Take 1 tablet by mouth in the morning and at bedtime.   . Multiple Vitamin (MULTIVITAMIN) capsule Take 1 capsule by mouth daily.    . pantoprazole (PROTONIX) 40 MG tablet Take 40 mg by mouth daily.  . pioglitazone (ACTOS) 30 MG tablet Take 30 mg by mouth daily.  . Potassium 99 MG TABS Take 99 mg by mouth daily.  . traMADol (ULTRAM) 50 MG tablet Take 50 mg by mouth 2 (two) times daily. Additional  50 mg if needed  . valACYclovir (VALTREX) 1000 MG tablet Take 1 tablet (1,000 mg total) by mouth 3 (three) times daily for 1 day.  . vitamin B-12 (CYANOCOBALAMIN) 1000 MCG tablet Take 1,000 mcg by mouth daily.   No current  facility-administered medications for this visit. (Other)      REVIEW OF SYSTEMS: ROS    Positive for: Cardiovascular, Eyes   Negative for: Constitutional, Gastrointestinal, Neurological, Skin, Genitourinary, Musculoskeletal, HENT, Endocrine, Respiratory, Psychiatric, Allergic/Imm, Heme/Lymph   Last edited by Debbrah Alar, COT on 12/23/2019  8:18 AM. (History)       ALLERGIES Allergies  Allergen Reactions  . Latex Rash    Latex tape  . Morphine And Related Itching    PAST MEDICAL HISTORY Past Medical History:  Diagnosis Date  . Allergies   . Arthritis   . Cataracts, bilateral   . Diabetes mellitus   . GERD (gastroesophageal reflux disease)   . Headache   . Hyperlipidemia   . Hypertension   . Retinal detachment    right eye  . Stroke [facility])    " mild"  . Wears glasses    Past Surgical History:  Procedure Laterality Date  . COLONOSCOPY    . EYE SURGERY Right 11/25/2019   SBP+PPV for RD repair - Dr. [name]  . GAS/FLUID EXCHANGE Right 11/25/2019   Procedure: Gas/Fluid Exchange;  Surgeon: [name], MD;  Location: [facility];  Service: Ophthalmology;  Laterality: Right;  . JOINT REPLACEMENT     B/L  . LIPOMA RESECTION     Dr. [name]  . PHOTOCOAGULATION Right 11/25/2019   Procedure: Photocoagulation;  Surgeon: [name], MD;  Location: [facility];  Service: Ophthalmology;  Laterality: Right;  . REPLACEMENT TOTAL KNEE  2010   Right, Dr. [name]  . REPLACEMENT TOTAL KNEE  2013   left  . RETINAL DETACHMENT SURGERY Right 11/25/2019   SBP+PPV - Dr. [name]  . VITRECTOMY 25 GAUGE WITH SCLERAL BUCKLE Right 11/25/2019   Procedure: VITRECTOMY 25 GAUGE WITH SCLERAL BUCKLE;  Surgeon: [name], MD;  Location: [facility];  Service: Ophthalmology;  Laterality: Right;    FAMILY HISTORY Family History  Problem Relation Age of Onset  . Stroke Mother   . Hypertension Mother   . Diabetes Sister   . Arthritis Maternal Aunt     SOCIAL HISTORY Social History   Tobacco  Use  . Smoking status: Never Smoker  . Smokeless tobacco: Never Used  Substance Use Topics  . Alcohol use: Yes    Comment: occassionaly  . Drug use: Never         OPHTHALMIC EXAM:  Base Eye Exam    Visual Acuity (Snellen - Linear)      Right Left   Dist [place] 20/200 +1 20/50   Dist ph [place] NI 20/40 +2  Pt does not have glasses today  Tonometry (Tonopen, 8:37 AM)      Right Left   Pressure 14 18       Pupils      Dark Light Shape React APD   Right 4 4 Round NR None   Left 3 2 Round Slow None       Visual Fields (Counting fingers)      Left Right    Full    Restrictions  Partial outer superior temporal, inferior temporal, superior nasal, inferior nasal deficiencies       Extraocular Movement      Right Left    Full, Ortho Full, Ortho       Neuro/Psych    Oriented x3: Yes   Mood/Affect: Normal       Dilation    Right eye: 1.0% Mydriacyl, 2.5% Phenylephrine @ 8:38 AM        Slit Lamp and Fundus Exam    Slit Lamp Exam      Right Left   Lids/Lashes Dermatochalasis - upper lid Dermatochalasis - upper lid   Conjunctiva/Sclera 1+ injection; sutures dissolving White and quiet   Cornea BCL in place; epi defect closed--heaped epithelium at closure --dendritic configuration; 1-2+ Punctate epithelial erosions, mild endo pigment, trace Descemet's folds centrally; mild corneal haze 1-2+Punctate epithelial erosions   Anterior Chamber Deep, 0.5+pigment Deep and quiet   Iris Round and dilated, No NVI Round and dilated, No NVI   Lens 2-3+ Nuclear sclerosis, 2-3+ Cortical cataract, 2-3+PSC 2-3+ Nuclear sclerosis, 2-3+ Cortical cataract   Vitreous post vitrectomy, ~50-55% gas bubble Vitreous syneresis, Posterior vitreous detachment       Fundus Exam      Right Left   Disc hazy view, perfused    C/D Ratio 0.2 0.2   Macula hazy view, flat under gas    Vessels Hazy view, Vascular attenuation, mild Tortuousity    Periphery Hazy view, retina attached over buckle, good  buckle height, good laser over buckle 360; ORIGINALLY: Inferior detachment from 0200-1030, Subretinal band IT midzone, ?small tear at 1030           IMAGING AND PROCEDURES  Imaging and Procedures for _0 @           ASSESSMENT/PLAN:    ICD-10-CM   1. Right retinal detachment  H33.21   2. Hypertensive retinopathy of both eyes  H35.033   3. Retinal edema  H35.81   4. Essential hypertension  I10   5. Combined forms of age-related cataract of both eyes  H25.813     1,2. 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 - pt unsure of symptom onset  - detached inferiorly from 0200 to 1030, fovea off, ?Small tear at 1030 -- no obvious large tear  - s/p SBP + PPV/PFC/EL/FAX/14% C3F8 OD, 03.18.21  - intra op - atrophic peripheral retina w/ microtears -- no frank/large tear             - retina attached and in good position -- good buckle height and laser around breaks  - BCL knocked out and replaced 3.31.21: Administrator, arts N&D BC: 8.6, DIA: 13.8, PWR: -0.25  - today: BCL in place and epi defect closed with heaped epi in dendritic pattern at closure             - IOP okay at 14  - gas bubble at 50-55%             - cont   PF 4x/day OD  zymaxid QID OD                         Brimonidine BID OD                          PSO ung QID OD   - start valacyclovir 1076m TID for possible herpetic component to corneal pathology             - cont face down positioning 30 mins/hr; avoid laying flat on back              - post op drop and positioning instructions reviewed              - tylenol/ibuprofen for pain              - Rx given for breakthrough pain  - f/u April 27, POV, K check  3,4. Hypertensive retinopathy OU  - discussed importance of tight BP control  - monitor  5. Mixed form age related cataracts OU  - The symptoms of cataract, surgical options, and treatments and risks were discussed with patient.  - discussed  diagnosis and progression  - specifically discussed likelihood of progression of cataract OD following RD repair   Ophthalmic Meds Ordered this visit:  Meds ordered this encounter  Medications  . valACYclovir (VALTREX) 1000 MG tablet    Sig: Take 1 tablet (1,000 mg total) by mouth 3 (three) times daily for 1 day.    Dispense:  90 tablet    Refill:  0       Return in about 12 days (around 01/04/2020) for f/u POV / K check OD, DFE, OCT.  There are no Patient Instructions on file for this visit.   Explained the diagnoses, plan, and follow up with the patient and they expressed understanding.  Patient expressed understanding of the importance of proper follow up care.   This document serves as a record of services personally performed by BGardiner Sleeper MD, PhD. It was created on their behalf by ALeeann Must CWaverly a certified ophthalmic assistant. The creation of this record is the provider's dictation and/or activities during the visit.    Electronically signed by: ALeeann Must COA _0 @ 8:23 PM   This document serves as a record of services personally performed by BGardiner Sleeper MD, PhD. It was created on their behalf by AErnest Mallick OA, an ophthalmic assistant. The creation of this record is the provider's dictation and/or activities during the visit.    Electronically signed by: AErnest Mallick OA 04.15.2021 8:23 PM   BGardiner Sleeper M.D., Ph.D. Diseases & Surgery of the Retina and Vitreous [facility] I have reviewed the above documentation for accuracy and completeness, and I agree with the above. BGardiner Sleeper M.D., Ph.D. 12/23/19 8:23 PM    Abbreviations: M myopia (nearsighted); A astigmatism; H hyperopia (farsighted); P presbyopia; Mrx spectacle prescription;  CTL contact lenses; OD right eye; OS left eye; OU both eyes  XT exotropia; ET esotropia; PEK punctate epithelial keratitis; PEE punctate epithelial erosions; DES dry eye syndrome;  MGD meibomian gland dysfunction; ATs artificial tears; PFAT's preservative free artificial tears; NKelly Ridgenuclear sclerotic cataract; PSC posterior subcapsular cataract; ERM epi-retinal membrane; PVD posterior vitreous detachment; RD retinal detachment; DM diabetes mellitus; DR diabetic retinopathy; NPDR non-proliferative diabetic retinopathy; PDR proliferative diabetic retinopathy; CSME clinically significant macular edema; DME diabetic macular edema; dbh dot  blot hemorrhages; CWS cotton wool spot; POAG primary open angle glaucoma; C/D cup-to-disc ratio; HVF humphrey visual field; GVF goldmann visual field; OCT optical coherence tomography; IOP intraocular pressure; BRVO Branch retinal vein occlusion; CRVO central retinal vein occlusion; CRAO central retinal artery occlusion; BRAO branch retinal artery occlusion; RT retinal tear; SB scleral buckle; PPV pars plana vitrectomy; VH Vitreous hemorrhage; PRP panretinal laser photocoagulation; IVK intravitreal kenalog; VMT vitreomacular traction; MH Macular hole;  NVD neovascularization of the disc; NVE neovascularization elsewhere; AREDS age related eye disease study; ARMD age related macular degeneration; POAG primary open angle glaucoma; EBMD epithelial/anterior basement membrane dystrophy; ACIOL anterior chamber intraocular lens; IOL intraocular lens; PCIOL posterior chamber intraocular lens; Phaco/IOL phacoemulsification with intraocular lens placement; Virgil photorefractive keratectomy; LASIK laser assisted in situ keratomileusis; HTN hypertension; DM diabetes mellitus; COPD chronic obstructive pulmonary disease

## 2019-12-23 ENCOUNTER — Encounter (INDEPENDENT_AMBULATORY_CARE_PROVIDER_SITE_OTHER): Payer: Self-pay | Admitting: Ophthalmology

## 2019-12-23 ENCOUNTER — Ambulatory Visit (INDEPENDENT_AMBULATORY_CARE_PROVIDER_SITE_OTHER): Payer: Medicare PPO | Admitting: Ophthalmology

## 2019-12-23 DIAGNOSIS — H35033 Hypertensive retinopathy, bilateral: Secondary | ICD-10-CM

## 2019-12-23 DIAGNOSIS — H3321 Serous retinal detachment, right eye: Secondary | ICD-10-CM

## 2019-12-23 DIAGNOSIS — I10 Essential (primary) hypertension: Secondary | ICD-10-CM

## 2019-12-23 DIAGNOSIS — H3581 Retinal edema: Secondary | ICD-10-CM

## 2019-12-23 DIAGNOSIS — H25813 Combined forms of age-related cataract, bilateral: Secondary | ICD-10-CM

## 2019-12-23 MED ORDER — VALACYCLOVIR HCL 1 G PO TABS: 1000.0000 mg | ORAL_TABLET | Freq: Three times a day (TID) | ORAL | 0 refills | Status: AC

## 2019-12-31 ENCOUNTER — Other Ambulatory Visit (INDEPENDENT_AMBULATORY_CARE_PROVIDER_SITE_OTHER): Payer: Self-pay

## 2019-12-31 MED ORDER — GATIFLOXACIN 0.5 % OP SOLN: 1.0000 [drp] | Freq: Four times a day (QID) | OPHTHALMIC | Status: DC

## 2020-01-02 ENCOUNTER — Other Ambulatory Visit (INDEPENDENT_AMBULATORY_CARE_PROVIDER_SITE_OTHER): Payer: Self-pay | Admitting: Ophthalmology

## 2020-01-03 ENCOUNTER — Other Ambulatory Visit (INDEPENDENT_AMBULATORY_CARE_PROVIDER_SITE_OTHER): Payer: Self-pay

## 2020-01-03 MED ORDER — GATIFLOXACIN 0.5 % OP SOLN: 1.0000 [drp] | Freq: Four times a day (QID) | OPHTHALMIC | Status: AC

## 2020-01-03 NOTE — Progress Notes (Signed)
[facility] Note  01/04/2020     CHIEF COMPLAINT Patient presents for Post-op Follow-up   HISTORY OF PRESENT ILLNESS: Mariah Cortez is a 69 y.o. female who presents to the clinic today for:   HPI    Post-op Follow-up    In right eye.  Discomfort includes pain.  Vision is stable.  I, the attending physician,  performed the HPI with the patient and updated documentation appropriately.          Comments    Patient states she has very mild pain OD and states her vision is about the same.  Patient denies any new or worsening floaters or fol OU.       Last edited by [name], MD on 01/04/2020 10:46 AM. (History)    pt states she is doing well, she is taking valacyclovir as directed as well as her drops, she states she has been laying on her back occasionally   Referring physician: [name], MD [address],  [place] [address]  HISTORICAL INFORMATION:   Selected notes from the MEDICAL RECORD NUMBER Referred by Dr. Orion Modest for concern of mac off RD   CURRENT MEDICATIONS: Current Outpatient Medications (Ophthalmic Drugs)  Medication Sig  . bacitracin-polymyxin b (POLYSPORIN) ophthalmic ointment Place into the right eye at bedtime. Place a 1/2 inch ribbon of ointment into the lower eyelid. (Patient taking differently: Place 1 application into the right eye 4 (four) times daily. Place a 1/2 inch ribbon of ointment into the lower eyelid.)  . brimonidine (ALPHAGAN) 0.2 % ophthalmic solution Place 1 drop into the right eye in the morning and at bedtime.  [name] gatifloxacin (ZYMAXID) 0.5 % SOLN INSTILL ONE DROP INTO THE RIGHT EYE FOUR TIMES DAILY  . prednisoLONE acetate (PRED FORTE) 1 % ophthalmic suspension Place 1 drop into the right eye 4 (four) times daily.   Current Facility-Administered Medications (Ophthalmic Drugs)  Medication Route  . gatifloxacin (ZYMAXID) 0.5 % ophthalmic drops 1 drop Right Eye   . gatifloxacin (ZYMAXID) 0.5 % ophthalmic drops 1 drop Right Eye   Current Outpatient Medications (Other)  Medication Sig  . APPLE CIDER VINEGAR PO Take 450 mg by mouth daily.  . Ascorbic Acid (VITAMIN C CR) 1000 MG TBCR Take 1,000 mg by mouth daily.   [name] aspirin 81 MG tablet Take 81 mg by mouth daily.    . cetirizine (ZYRTEC) 10 MG tablet Take 10 mg by mouth daily.    . Cholecalciferol (VITAMIN D3) 50 MCG (2000 UT) TABS Take 2,000 mg by mouth daily.  . Cysteamine Bitartrate (PROCYSBI) 300 MG PACK by XX route as directed  . esomeprazole (NEXIUM) 20 MG capsule Take 20 mg by mouth daily as needed (Heartburn).  . Ferrous Sulfate (IRON) 325 (65 FE) MG TABS Take 325 mg by mouth in the morning and at bedtime.   [name] glimepiride (AMARYL) 4 MG tablet Take 4 mg by mouth 2 (two) times daily.  . hydrochlorothiazide (HYDRODIURIL) 25 MG tablet Take 25 mg by mouth daily.  [name] HYDROcodone-acetaminophen (NORCO/VICODIN) 5-325 MG tablet Take 1 tablet by mouth every 4 (four) hours as needed for moderate pain.  [name] losartan (COZAAR) 50 MG tablet Take 50 mg by mouth daily.  [name] lovastatin (MEVACOR) 40 MG tablet Take 40 mg by mouth 2 (two) times daily.    . meloxicam (MOBIC) 7.5 MG tablet Take 7.5 mg by mouth daily.  . metFORMIN (GLUCOPHAGE) 1000 MG  tablet Take 1,000 mg by mouth in the morning and at bedtime.   . Misc Natural Products (OSTEO BI-FLEX JOINT SHIELD PO) Take 1 tablet by mouth in the morning and at bedtime.   . Multiple Vitamin (MULTIVITAMIN) capsule Take 1 capsule by mouth daily.    . pantoprazole (PROTONIX) 40 MG tablet Take 40 mg by mouth daily.  . pioglitazone (ACTOS) 30 MG tablet Take 30 mg by mouth daily.  . Potassium 99 MG TABS Take 99 mg by mouth daily.  . traMADol (ULTRAM) 50 MG tablet Take 50 mg by mouth 2 (two) times daily. Additional  50 mg if needed  . vitamin B-12 (CYANOCOBALAMIN) 1000 MCG tablet Take 1,000 mcg by mouth daily.   No current facility-administered medications for this visit.  (Other)      REVIEW OF SYSTEMS: ROS    Positive for: Cardiovascular, Eyes   Negative for: Constitutional, Gastrointestinal, Neurological, Skin, Genitourinary, Musculoskeletal, HENT, Endocrine, Respiratory, Psychiatric, Allergic/Imm, Heme/Lymph   Last edited by [name] on 01/04/2020  9:46 AM. (History)       ALLERGIES Allergies  Allergen Reactions  . Latex Rash    Latex tape  . Morphine And Related Itching    PAST MEDICAL HISTORY Past Medical History:  Diagnosis Date  . Allergies   . Arthritis   . Cataracts, bilateral   . Diabetes mellitus   . GERD (gastroesophageal reflux disease)   . Headache   . Hyperlipidemia   . Hypertension   . Retinal detachment    right eye  . Stroke [facility])    " mild"  . Wears glasses    Past Surgical History:  Procedure Laterality Date  . COLONOSCOPY    . EYE SURGERY Right 11/25/2019   SBP+PPV for RD repair - Dr. [name]  . GAS/FLUID EXCHANGE Right 11/25/2019   Procedure: Gas/Fluid Exchange;  Surgeon: [name], MD;  Location: [facility];  Service: Ophthalmology;  Laterality: Right;  . JOINT REPLACEMENT     B/L  . LIPOMA RESECTION     Dr. [name]  . PHOTOCOAGULATION Right 11/25/2019   Procedure: Photocoagulation;  Surgeon: [name], MD;  Location: [facility];  Service: Ophthalmology;  Laterality: Right;  . REPLACEMENT TOTAL KNEE  2010   Right, Dr. [name]  . REPLACEMENT TOTAL KNEE  2013   left  . RETINAL DETACHMENT SURGERY Right 11/25/2019   SBP+PPV - Dr. [name]  . VITRECTOMY 25 GAUGE WITH SCLERAL BUCKLE Right 11/25/2019   Procedure: VITRECTOMY 25 GAUGE WITH SCLERAL BUCKLE;  Surgeon: [name], MD;  Location: [facility];  Service: Ophthalmology;  Laterality: Right;    FAMILY HISTORY Family History  Problem Relation Age of Onset  . Stroke Mother   . Hypertension Mother   . Diabetes Sister   . Arthritis Maternal Aunt     SOCIAL HISTORY Social History   Tobacco Use  . Smoking status: Never Smoker  . Smokeless  tobacco: Never Used  Substance Use Topics  . Alcohol use: Yes    Comment: occassionaly  . Drug use: Never         OPHTHALMIC EXAM:  Base Eye Exam    Visual Acuity (Snellen - Linear)      Right Left   Dist [place] 20/250 -1    Dist cc  20/20 -2   Dist ph [place] NI    Correction: Glasses       Tonometry (Tonopen, 9:52 AM)      Right Left   Pressure 16 18  Pupils      Dark Light Shape React APD   Right 6 6 Round dilated 0   Left 3 2 Round Minimal 0       Visual Fields      Left Right    Full    Restrictions  Partial outer superior nasal deficiency       Extraocular Movement      Right Left    Full Full       Neuro/Psych    Oriented x3: Yes   Mood/Affect: Normal       Dilation    Right eye: 1.0% Mydriacyl, 2.5% Phenylephrine @ 9:53 AM        Slit Lamp and Fundus Exam    Slit Lamp Exam      Right Left   Lids/Lashes Dermatochalasis - upper lid Dermatochalasis - upper lid   Conjunctiva/Sclera White and quiet, sutures dissolving White and quiet   Cornea BCL in place; epi defect closed -- 3 dendritic-like lesions centrally, 2-3+ Punctate epithelial erosions, mild endo pigment, trace-1+ Descemet's folds centrally; 1+ corneal haze 1-2+Punctate epithelial erosions   Anterior Chamber Deep and quiet Deep and quiet   Iris Round and dilated, irregular, focal Posterior synechiae at 1030 Round and dilated, No NVI   Lens 2-3+ Nuclear sclerosis, 2-3+ Cortical cataract, 3+PSC 2-3+ Nuclear sclerosis, 2-3+ Cortical cataract   Vitreous post vitrectomy, ~35-40% gas bubble Vitreous syneresis, Posterior vitreous detachment       Fundus Exam      Right Left   Disc mild Pallor, Sharp rim    C/D Ratio 0.2 0.2   Macula Flat, re-attached    Vessels Vascular attenuation, mild Tortuousity    Periphery retina attached over buckle, good buckle height, good laser over buckle 360; ORIGINALLY: Inferior detachment from 0200-1030, Subretinal band IT midzone, ?small tear at 1030          Refraction    Wearing Rx      Sphere Cylinder Axis Add   Right -0.50 +1.75 161 +2.25   Left -1.50 +1.00 002 +2.25   Type: PAL          IMAGING AND PROCEDURES  Imaging and Procedures for _0 @  OCT, Retina - OU - Both Eyes       Right Eye Quality was borderline. Central Foveal Thickness: 261. Progression has improved. Findings include no SRF, no IRF, normal foveal contour, outer retinal atrophy (Focal PED superior to fovea, ORA, mild ERM (inferior macula)).   Left Eye Quality was good. Central Foveal Thickness: 250. Progression has been stable. Findings include normal foveal contour, no IRF, no SRF.   Notes *Images captured and stored on drive  Diagnosis / Impression:  OD: retina re-attached -- Focal PED superior to fovea, ORA, mild ERM (inferior macula) OS: NFP, no IRF/SRF  Clinical management:  See below  Abbreviations: NFP - Normal foveal profile. CME - cystoid macular edema. PED - pigment epithelial detachment. IRF - intraretinal fluid. SRF - subretinal fluid. EZ - ellipsoid zone. ERM - epiretinal membrane. ORA - outer retinal atrophy. ORT - outer retinal tubulation. SRHM - subretinal hyper-reflective material                 ASSESSMENT/PLAN:    ICD-10-CM   1. Right retinal detachment  H33.21   2. Hypertensive retinopathy of both eyes  H35.033   3. Retinal edema  H35.81 OCT, Retina - OU - Both Eyes  4. Essential hypertension  I10   5. Combined forms of  age-related cataract of both eyes  H25.813     1,2. 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 - pt unsure of symptom onset  - detached inferiorly from 0200 to 1030, fovea off, ?Small tear at 1030 -- no obvious large tear  - s/p SBP + PPV/PFC/EL/FAX/14% C3F8 OD, 03.18.21  - intra op - atrophic peripheral retina w/ microtears -- no frank/large tear             - retina attached and in good position -- good buckle height and laser around breaks  - BCL knocked  out and replaced 3.31.21: Administrator, arts N&D BC: 8.6, DIA: 13.8, PWR: -0.25  - today: BCL removed, 3 dendritic-like lesions centrally             - IOP okay at 16  - gas bubble at 35-40%             - cont   PF 4x/day OD                          zymaxid QID OD                         Brimonidine BID OD                          PSO ung QID OD   - cont valacyclovir 1053m TID for possible herpetic component to corneal pathology             - cont face down positioning 30 mins/hr; avoid laying flat on back              - post op drop and positioning instructions reviewed              - tylenol/ibuprofen for pain              - Rx given for breakthrough pain  - f/u 2 weeks, POV, K check  3,4. Hypertensive retinopathy OU  - discussed importance of tight BP control  - monitor  5. Mixed form age related cataracts OU  - The symptoms of cataract, surgical options, and treatments and risks were discussed with patient.  - discussed diagnosis and progression  - specifically discussed likelihood of progression of cataract OD following RD repair  - OD with significant PSC   Ophthalmic Meds Ordered this visit:  No orders of the defined types were placed in this encounter.      Return in about 2 weeks (around 01/18/2020) for f/u RD OD, DFE, OCT, K check.  There are no Patient Instructions on file for this visit.   Explained the diagnoses, plan, and follow up with the patient and they expressed understanding.  Patient expressed understanding of the importance of proper follow up care.   This document serves as a record of services personally performed by BGardiner Sleeper MD, PhD. It was created on their behalf by AEstill Bakes COT an ophthalmic technician. The creation of this record is the provider's dictation and/or activities during the visit.    Electronically signed by: AEstill Bakes COT 01/03/20 @ 12:03 AM  This document serves as a record of services personally performed by BGardiner Sleeper MD, PhD. It was created on their behalf by AErnest Mallick OA, an ophthalmic assistant. The creation of this record is the provider's dictation and/or activities  during the visit.    Electronically signed by: Ernest Mallick, OA 04.27.2021 12:03 AM  Gardiner Sleeper, M.D., Ph.D. Diseases & Surgery of the Retina and [place] 01/04/2020   I have reviewed the above documentation for accuracy and completeness, and I agree with the above. Gardiner Sleeper, M.D., Ph.D. 01/05/20 12:03 AM    Abbreviations: M myopia (nearsighted); A astigmatism; H hyperopia (farsighted); P presbyopia; Mrx spectacle prescription;  CTL contact lenses; OD right eye; OS left eye; OU both eyes  XT exotropia; ET esotropia; PEK punctate epithelial keratitis; PEE punctate epithelial erosions; DES dry eye syndrome; MGD meibomian gland dysfunction; ATs artificial tears; PFAT's preservative free artificial tears; [place] nuclear sclerotic cataract; PSC posterior subcapsular cataract; ERM epi-retinal membrane; PVD posterior vitreous detachment; RD retinal detachment; DM diabetes mellitus; DR diabetic retinopathy; NPDR non-proliferative diabetic retinopathy; PDR proliferative diabetic retinopathy; CSME clinically significant macular edema; DME diabetic macular edema; dbh dot blot hemorrhages; CWS cotton wool spot; POAG primary open angle glaucoma; C/D cup-to-disc ratio; HVF humphrey visual field; GVF goldmann visual field; OCT optical coherence tomography; IOP intraocular pressure; BRVO Branch retinal vein occlusion; CRVO central retinal vein occlusion; CRAO central retinal artery occlusion; BRAO branch retinal artery occlusion; RT retinal tear; SB scleral buckle; PPV pars plana vitrectomy; VH Vitreous hemorrhage; PRP panretinal laser photocoagulation; IVK intravitreal kenalog; VMT vitreomacular traction; MH Macular hole;  NVD neovascularization of the disc; NVE neovascularization elsewhere; AREDS age related  eye disease study; ARMD age related macular degeneration; POAG primary open angle glaucoma; EBMD epithelial/anterior basement membrane dystrophy; ACIOL anterior chamber intraocular lens; IOL intraocular lens; PCIOL posterior chamber intraocular lens; Phaco/IOL phacoemulsification with intraocular lens placement; [place] photorefractive keratectomy; LASIK laser assisted in situ keratomileusis; HTN hypertension; DM diabetes mellitus; COPD chronic obstructive pulmonary disease

## 2020-01-04 ENCOUNTER — Ambulatory Visit (INDEPENDENT_AMBULATORY_CARE_PROVIDER_SITE_OTHER): Payer: Medicare PPO | Admitting: Ophthalmology

## 2020-01-04 DIAGNOSIS — H35033 Hypertensive retinopathy, bilateral: Secondary | ICD-10-CM

## 2020-01-04 DIAGNOSIS — H3581 Retinal edema: Secondary | ICD-10-CM

## 2020-01-04 DIAGNOSIS — H25813 Combined forms of age-related cataract, bilateral: Secondary | ICD-10-CM

## 2020-01-04 DIAGNOSIS — I10 Essential (primary) hypertension: Secondary | ICD-10-CM

## 2020-01-04 DIAGNOSIS — H3321 Serous retinal detachment, right eye: Secondary | ICD-10-CM

## 2020-01-05 ENCOUNTER — Encounter (INDEPENDENT_AMBULATORY_CARE_PROVIDER_SITE_OTHER): Payer: Self-pay | Admitting: Ophthalmology

## 2020-01-12 NOTE — Progress Notes (Signed)
[facility] Note  01/21/2020     CHIEF COMPLAINT Patient presents for Post-op Follow-up   HISTORY OF PRESENT ILLNESS: Mariah Cortez is a 69 y.o. female who presents to the clinic today for:   HPI    Post-op Follow-up    In right eye.  Discomfort includes none.  Vision is stable.  I, the attending physician,  performed the HPI with the patient and updated documentation appropriately.          Comments    Pt states vision is about the same OD.  Patient denies eye pain or discomfort and denies any new or worsening floaters or fol OU.       Last edited by [name], MD on 01/22/2020  9:44 PM. (History)    pt feels like her vision is better today  Referring physician: [name], [address],  [place] [address]  HISTORICAL INFORMATION:   Selected notes from the MEDICAL RECORD NUMBER Referred by Dr. Orion Modest for concern of mac off RD   CURRENT MEDICATIONS: Current Outpatient Medications (Ophthalmic Drugs)  Medication Sig  . bacitracin-polymyxin b (POLYSPORIN) ophthalmic ointment Place into the right eye at bedtime. Place a 1/2 inch ribbon of ointment into the lower eyelid. (Patient taking differently: Place 1 application into the right eye 4 (four) times daily. Place a 1/2 inch ribbon of ointment into the lower eyelid.)  . brimonidine (ALPHAGAN) 0.2 % ophthalmic solution Place 1 drop into the right eye in the morning and at bedtime.  [name] gatifloxacin (ZYMAXID) 0.5 % SOLN INSTILL ONE DROP INTO THE RIGHT EYE FOUR TIMES DAILY  . prednisoLONE acetate (PRED FORTE) 1 % ophthalmic suspension Place 1 drop into the right eye 4 (four) times daily.   Current Facility-Administered Medications (Ophthalmic Drugs)  Medication Route  . gatifloxacin (ZYMAXID) 0.5 % ophthalmic drops 1 drop Right Eye  . gatifloxacin (ZYMAXID) 0.5 % ophthalmic drops 1 drop Right Eye   Current Outpatient Medications (Other)   Medication Sig  . APPLE CIDER VINEGAR PO Take 450 mg by mouth daily.  . Ascorbic Acid (VITAMIN C CR) 1000 MG TBCR Take 1,000 mg by mouth daily.   [name] aspirin 81 MG tablet Take 81 mg by mouth daily.    . cetirizine (ZYRTEC) 10 MG tablet Take 10 mg by mouth daily.    . Cholecalciferol (VITAMIN D3) 50 MCG (2000 UT) TABS Take 2,000 mg by mouth daily.  . Cysteamine Bitartrate (PROCYSBI) 300 MG PACK by XX route as directed  . esomeprazole (NEXIUM) 20 MG capsule Take 20 mg by mouth daily as needed (Heartburn).  . Ferrous Sulfate (IRON) 325 (65 FE) MG TABS Take 325 mg by mouth in the morning and at bedtime.   [name] glimepiride (AMARYL) 4 MG tablet Take 4 mg by mouth 2 (two) times daily.  . hydrochlorothiazide (HYDRODIURIL) 25 MG tablet Take 25 mg by mouth daily.  [name] HYDROcodone-acetaminophen (NORCO/VICODIN) 5-325 MG tablet Take 1 tablet by mouth every 4 (four) hours as needed for moderate pain.  [name] losartan (COZAAR) 50 MG tablet Take 50 mg by mouth daily.  [name] lovastatin (MEVACOR) 40 MG tablet Take 40 mg by mouth 2 (two) times daily.    . meloxicam (MOBIC) 7.5 MG tablet Take 7.5 mg by mouth daily.  . metFORMIN (GLUCOPHAGE) 1000 MG tablet Take 1,000 mg by mouth in the morning and at bedtime.   . Misc Natural Products (OSTEO BI-FLEX JOINT  SHIELD PO) Take 1 tablet by mouth in the morning and at bedtime.   . Multiple Vitamin (MULTIVITAMIN) capsule Take 1 capsule by mouth daily.    . pantoprazole (PROTONIX) 40 MG tablet Take 40 mg by mouth daily.  . pioglitazone (ACTOS) 30 MG tablet Take 30 mg by mouth daily.  . Potassium 99 MG TABS Take 99 mg by mouth daily.  . traMADol (ULTRAM) 50 MG tablet Take 50 mg by mouth 2 (two) times daily. Additional  50 mg if needed  . valACYclovir (VALTREX) 1000 MG tablet Take 1 tablet (1,000 mg total) by mouth 3 (three) times daily.  . vitamin B-12 (CYANOCOBALAMIN) 1000 MCG tablet Take 1,000 mcg by mouth daily.   No current facility-administered medications for this visit. (Other)       REVIEW OF SYSTEMS: ROS    Positive for: Cardiovascular, Eyes   Negative for: Constitutional, Gastrointestinal, Neurological, Skin, Genitourinary, Musculoskeletal, HENT, Endocrine, Respiratory, Psychiatric, Allergic/Imm, Heme/Lymph   Last edited by [name] on 01/21/2020  2:48 PM. (History)       ALLERGIES Allergies  Allergen Reactions  . Latex Rash    Latex tape  . Morphine And Related Itching    PAST MEDICAL HISTORY Past Medical History:  Diagnosis Date  . Allergies   . Arthritis   . Cataracts, bilateral   . Diabetes mellitus   . GERD (gastroesophageal reflux disease)   . Headache   . Hyperlipidemia   . Hypertension   . Retinal detachment    right eye  . Stroke [facility])    " mild"  . Wears glasses    Past Surgical History:  Procedure Laterality Date  . COLONOSCOPY    . EYE SURGERY Right 11/25/2019   SBP+PPV for RD repair - Dr. [name]  . GAS/FLUID EXCHANGE Right 11/25/2019   Procedure: Gas/Fluid Exchange;  Surgeon: [name], MD;  Location: [place];  Service: Ophthalmology;  Laterality: Right;  . JOINT REPLACEMENT     B/L  . LIPOMA RESECTION     Dr. [name]  . PHOTOCOAGULATION Right 11/25/2019   Procedure: Photocoagulation;  Surgeon: [name], MD;  Location: [place];  Service: Ophthalmology;  Laterality: Right;  . REPLACEMENT TOTAL KNEE  2010   Right, Dr. [name]  . REPLACEMENT TOTAL KNEE  2013   left  . RETINAL DETACHMENT SURGERY Right 11/25/2019   SBP+PPV - Dr. [name]  . VITRECTOMY 25 GAUGE WITH SCLERAL BUCKLE Right 11/25/2019   Procedure: VITRECTOMY 25 GAUGE WITH SCLERAL BUCKLE;  Surgeon: [name], MD;  Location: [employer];  Service: Ophthalmology;  Laterality: Right;    FAMILY HISTORY Family History  Problem Relation Age of Onset  . Stroke Mother   . Hypertension Mother   . Diabetes Sister   . Arthritis Maternal Aunt     SOCIAL HISTORY Social History   Tobacco Use  . Smoking status: Never Smoker  . Smokeless tobacco:  Never Used  Substance Use Topics  . Alcohol use: Yes    Comment: occassionaly  . Drug use: Never         OPHTHALMIC EXAM:  Base Eye Exam    Visual Acuity (Snellen - Linear)      Right Left   Dist [place] 20/200 -1    Dist cc  20/20 -1   Dist ph [place] 20/200        Tonometry (Tonopen, 2:52 PM)      Right Left   Pressure 15 16  Pupils      Dark Light Shape React APD   Right 6 5 Round Brisk 0   Left 3 2 Round Brisk 0       Visual Fields      Left Right    Full    Restrictions  Partial outer superior temporal, superior nasal, inferior nasal deficiencies       Extraocular Movement      Right Left    Full Full       Neuro/Psych    Oriented x3: Yes   Mood/Affect: Normal       Dilation    Both eyes: 1.0% Mydriacyl, 2.5% Phenylephrine @ 2:52 PM        Slit Lamp and Fundus Exam    Slit Lamp Exam      Right Left   Lids/Lashes Dermatochalasis - upper lid Dermatochalasis - upper lid   Conjunctiva/Sclera White and quiet, sutures dissolving White and quiet   Cornea epi defect closed -- 3 dendritic-like lesions centrally gone, 1-2+ Punctate epithelial erosions, mild endo pigment, Descemet's folds - resolved; mild corneal haze 2+Punctate epithelial erosions   Anterior Chamber Deep and quiet Deep and quiet   Iris Round and dilated, irregular, focal Posterior synechiae at 1030 Round and dilated, No NVI   Lens 3+ Nuclear sclerosis, 3+ Cortical cataract, 3+PSC 2-3+ Nuclear sclerosis, 2-3+ Cortical cataract   Vitreous post vitrectomy, ~10% gas bubble Vitreous syneresis, Posterior vitreous detachment       Fundus Exam      Right Left   Disc very hazy view, mild Pallor, Sharp rim Pink and Sharp   C/D Ratio 0.2 0.2   Macula hazy view, Flat, re-attached, blunted foveal reflex, mild RPE mottling, focal punctate PED superior to macula Flat, Blunted foveal reflex, Retinal pigment epithelial mottling, No heme or edema   Vessels Vascular attenuation Vascular attenuation, mild  Tortuousity   Periphery retina attached over buckle, good buckle height, good laser over buckle 360; ORIGINALLY: Inferior detachment from 0200-1030, Subretinal band IT midzone, ?small tear at 1030 Attached, 2 focal areas of pigment clumping temporally, No RT/RD on 360 scleral depression         Refraction    Wearing Rx      Sphere Cylinder Axis Add   Right -0.50 +1.75 161 +2.25   Left -1.50 +1.00 002 +2.25   Type: PAL          IMAGING AND PROCEDURES  Imaging and Procedures for _0 @  OCT, Retina - OU - Both Eyes       Right Eye Quality was borderline. Central Foveal Thickness: 285. Progression has worsened. Findings include no SRF, no IRF, normal foveal contour, outer retinal atrophy, epiretinal membrane (Retina attached, Focal PED superior to fovea, ORA, mild interval progression of ERM (inferior macula)).   Left Eye Quality was good. Central Foveal Thickness: 247. Progression has been stable. Findings include normal foveal contour, no IRF, no SRF.   Notes *Images captured and stored on drive  Diagnosis / Impression:  OD: retina re-attached -- Retina attached, Focal PED superior to fovea, ORA, mild interval progression of ERM (inferior macula) OS: NFP, no IRF/SRF  Clinical management:  See below  Abbreviations: NFP - Normal foveal profile. CME - cystoid macular edema. PED - pigment epithelial detachment. IRF - intraretinal fluid. SRF - subretinal fluid. EZ - ellipsoid zone. ERM - epiretinal membrane. ORA - outer retinal atrophy. ORT - outer retinal tubulation. SRHM - subretinal hyper-reflective material  ASSESSMENT/PLAN:    ICD-10-CM   1. Right retinal detachment  H33.21   2. Hypertensive retinopathy of both eyes  H35.033   3. Retinal edema  H35.81 OCT, Retina - OU - Both Eyes  4. Essential hypertension  I10   5. Combined forms of age-related cataract of both eyes  H25.813     1,2. Retinal detachment, right eye  - bullous inferior mac off  detachment -- likely chronic - pt unsure of symptom onset  - detached inferiorly from 0200 to 1030, fovea off, ?Small tear at 1030 -- no obvious large tear  - s/p SBP + PPV/PFC/EL/FAX/14% C3F8 OD, 03.18.21  - intra op - atrophic peripheral retina w/ microtears -- no frank/large tear             - retina attached and in good position -- good buckle height and laser around breaks  - cornea doing well without BCL -- no dendritic lesions but still 1-2+ PEE and mild corneal haze             - IOP okay at 15  - gas bubble at 10%             - cont   PF 4x/day OD                          zymaxid QID OD -- okay to stop                         Brimonidine BID OD -- decrease to Qdaily                         PSO ung QID OD   - cont valacyclovir 1060m TID for possible herpetic component to corneal pathology             - avoid laying flat on back              - post op drop and positioning instructions reviewed              - tylenol/ibuprofen for pain   - f/u 3 weeks, POV, K check  3,4. Hypertensive retinopathy OU  - discussed importance of tight BP control  - monitor  5. Mixed form age related cataracts OU  - The symptoms of cataract, surgical options, and treatments and risks were discussed with patient.  - discussed diagnosis and progression  - specifically discussed likelihood of progression of cataract OD following RD repair  - OD with significant PSC   Ophthalmic Meds Ordered this visit:  Meds ordered this encounter  Medications  . valACYclovir (VALTREX) 1000 MG tablet    Sig: Take 1 tablet (1,000 mg total) by mouth 3 (three) times daily.    Dispense:  90 tablet    Refill:  2       Return in about 3 weeks (around 02/11/2020) for f/u RD OD, DFE, OCT.  There are no Patient Instructions on file for this visit.   Explained the diagnoses, plan, and follow up with the patient and they expressed understanding.  Patient expressed understanding of the importance of proper follow up  care.   This document serves as a record of services personally performed by BGardiner Sleeper MD, PhD. It was created on their behalf by AErnest Mallick OA, an ophthalmic assistant. The creation of this record is the provider's dictation and/or activities during the  visit.    Electronically signed by: Ernest Mallick, OA 05.05.2021 9:52 PM  Gardiner Sleeper, M.D., Ph.D. Diseases & Surgery of the Retina and Vitreous [facility]  I have reviewed the above documentation for accuracy and completeness, and I agree with the above. Gardiner Sleeper, M.D., Ph.D. 01/22/20 9:52 PM   Abbreviations: M myopia (nearsighted); A astigmatism; H hyperopia (farsighted); P presbyopia; Mrx spectacle prescription;  CTL contact lenses; OD right eye; OS left eye; OU both eyes  XT exotropia; ET esotropia; PEK punctate epithelial keratitis; PEE punctate epithelial erosions; DES dry eye syndrome; MGD meibomian gland dysfunction; ATs artificial tears; PFAT's preservative free artificial tears; [place] nuclear sclerotic cataract; PSC posterior subcapsular cataract; ERM epi-retinal membrane; PVD posterior vitreous detachment; RD retinal detachment; DM diabetes mellitus; DR diabetic retinopathy; NPDR non-proliferative diabetic retinopathy; PDR proliferative diabetic retinopathy; CSME clinically significant macular edema; DME diabetic macular edema; dbh dot blot hemorrhages; CWS cotton wool spot; POAG primary open angle glaucoma; C/D cup-to-disc ratio; HVF humphrey visual field; GVF goldmann visual field; OCT optical coherence tomography; IOP intraocular pressure; BRVO Branch retinal vein occlusion; CRVO central retinal vein occlusion; CRAO central retinal artery occlusion; BRAO branch retinal artery occlusion; RT retinal tear; SB scleral buckle; PPV pars plana vitrectomy; VH Vitreous hemorrhage; PRP panretinal laser photocoagulation; IVK intravitreal kenalog; VMT vitreomacular traction; MH Macular hole;  NVD  neovascularization of the disc; NVE neovascularization elsewhere; AREDS age related eye disease study; ARMD age related macular degeneration; POAG primary open angle glaucoma; EBMD epithelial/anterior basement membrane dystrophy; ACIOL anterior chamber intraocular lens; IOL intraocular lens; PCIOL posterior chamber intraocular lens; Phaco/IOL phacoemulsification with intraocular lens placement; [place] photorefractive keratectomy; LASIK laser assisted in situ keratomileusis; HTN hypertension; DM diabetes mellitus; COPD chronic obstructive pulmonary disease

## 2020-01-21 ENCOUNTER — Other Ambulatory Visit: Payer: Self-pay

## 2020-01-21 ENCOUNTER — Ambulatory Visit (INDEPENDENT_AMBULATORY_CARE_PROVIDER_SITE_OTHER): Payer: Medicare PPO | Admitting: Ophthalmology

## 2020-01-21 DIAGNOSIS — H3581 Retinal edema: Secondary | ICD-10-CM

## 2020-01-21 DIAGNOSIS — H3321 Serous retinal detachment, right eye: Secondary | ICD-10-CM

## 2020-01-21 DIAGNOSIS — H25813 Combined forms of age-related cataract, bilateral: Secondary | ICD-10-CM

## 2020-01-21 DIAGNOSIS — I10 Essential (primary) hypertension: Secondary | ICD-10-CM

## 2020-01-21 DIAGNOSIS — H35033 Hypertensive retinopathy, bilateral: Secondary | ICD-10-CM

## 2020-01-21 MED ORDER — VALACYCLOVIR HCL 1 G PO TABS: 1000.0000 mg | ORAL_TABLET | Freq: Three times a day (TID) | ORAL | 2 refills | Status: DC

## 2020-01-22 ENCOUNTER — Encounter (INDEPENDENT_AMBULATORY_CARE_PROVIDER_SITE_OTHER): Payer: Self-pay | Admitting: Ophthalmology

## 2020-02-09 NOTE — Progress Notes (Addendum)
[facility] Note  02/14/2020     CHIEF COMPLAINT Patient presents for Post-op Follow-up   HISTORY OF PRESENT ILLNESS: Mariah Cortez is a 69 y.o. female who presents to the clinic today for:   HPI    Post-op Follow-up    In right eye.  Vision is improved.          Comments    11 week post op OD- "The bubble is gone but I still can't see' OD.  She isn't sure if it is due to cataracts or not.  She wants to discuss her RUL, it is still not opening as much as she would like since sx. Brimonidine qd OD, Polysporin ung qhs OD, Prednisolone QID OD       Last edited by Leonie Douglas, COA on 02/14/2020 10:30 AM. (History)    pt feels like her vision is better today;  Pt states the gas bubble has been gone since last Tuesday  Referring physician: [name], [facility]  [place] [address]  HISTORICAL INFORMATION:   Selected notes from the MEDICAL RECORD NUMBER Referred by Dr. Orion Modest for concern of mac off RD   CURRENT MEDICATIONS: Current Outpatient Medications (Ophthalmic Drugs)  Medication Sig  . bacitracin-polymyxin b (POLYSPORIN) ophthalmic ointment Place into the right eye at bedtime. Place a 1/2 inch ribbon of ointment into the lower eyelid. (Patient taking differently: Place 1 application into the right eye 4 (four) times daily. Place a 1/2 inch ribbon of ointment into the lower eyelid.)  . brimonidine (ALPHAGAN) 0.2 % ophthalmic solution Place 1 drop into the right eye in the morning and at bedtime.  [name] gatifloxacin (ZYMAXID) 0.5 % SOLN INSTILL ONE DROP INTO THE RIGHT EYE FOUR TIMES DAILY  . prednisoLONE acetate (PRED FORTE) 1 % ophthalmic suspension Place 1 drop into the right eye 4 (four) times daily.   Current Facility-Administered Medications (Ophthalmic Drugs)  Medication Route  . gatifloxacin (ZYMAXID) 0.5 % ophthalmic drops 1 drop Right Eye   Current Outpatient Medications (Other)   Medication Sig  . APPLE CIDER VINEGAR PO Take 450 mg by mouth daily.  . Ascorbic Acid (VITAMIN C CR) 1000 MG TBCR Take 1,000 mg by mouth daily.   [name] aspirin 81 MG tablet Take 81 mg by mouth daily.    . cetirizine (ZYRTEC) 10 MG tablet Take 10 mg by mouth daily.    . Cholecalciferol (VITAMIN D3) 50 MCG (2000 UT) TABS Take 2,000 mg by mouth daily.  . Cysteamine Bitartrate (PROCYSBI) 300 MG PACK by XX route as directed  . esomeprazole (NEXIUM) 20 MG capsule Take 20 mg by mouth daily as needed (Heartburn).  . Ferrous Sulfate (IRON) 325 (65 FE) MG TABS Take 325 mg by mouth in the morning and at bedtime.   [name] glimepiride (AMARYL) 4 MG tablet Take 4 mg by mouth 2 (two) times daily.  . hydrochlorothiazide (HYDRODIURIL) 25 MG tablet Take 25 mg by mouth daily.  [name] HYDROcodone-acetaminophen (NORCO/VICODIN) 5-325 MG tablet Take 1 tablet by mouth every 4 (four) hours as needed for moderate pain.  [name] losartan (COZAAR) 50 MG tablet Take 50 mg by mouth daily.  [name] lovastatin (MEVACOR) 40 MG tablet Take 40 mg by mouth 2 (two) times daily.    . meloxicam (MOBIC) 7.5 MG tablet Take 7.5 mg by mouth daily.  . metFORMIN (GLUCOPHAGE) 1000 MG tablet Take 1,000 mg by mouth in the morning and  at bedtime.   . Misc Natural Products (OSTEO BI-FLEX JOINT SHIELD PO) Take 1 tablet by mouth in the morning and at bedtime.   . Multiple Vitamin (MULTIVITAMIN) capsule Take 1 capsule by mouth daily.    . pantoprazole (PROTONIX) 40 MG tablet Take 40 mg by mouth daily.  . pioglitazone (ACTOS) 30 MG tablet Take 30 mg by mouth daily.  . Potassium 99 MG TABS Take 99 mg by mouth daily.  . traMADol (ULTRAM) 50 MG tablet Take 50 mg by mouth 2 (two) times daily. Additional  50 mg if needed  . valACYclovir (VALTREX) 1000 MG tablet Take 1 tablet (1,000 mg total) by mouth 3 (three) times daily.  . vitamin B-12 (CYANOCOBALAMIN) 1000 MCG tablet Take 1,000 mcg by mouth daily.   No current facility-administered medications for this visit. (Other)       REVIEW OF SYSTEMS: ROS    Positive for: Gastrointestinal, Cardiovascular, Eyes   Negative for: Constitutional, Neurological, Skin, Genitourinary, Musculoskeletal, HENT, Endocrine, Respiratory, Psychiatric, Allergic/Imm, Heme/Lymph   Last edited by Leonie Douglas, COA on 02/14/2020 10:22 AM. (History)       ALLERGIES Allergies  Allergen Reactions  . Latex Rash    Latex tape  . Morphine And Related Itching    PAST MEDICAL HISTORY Past Medical History:  Diagnosis Date  . Allergies   . Arthritis   . Cataracts, bilateral   . Diabetes mellitus   . GERD (gastroesophageal reflux disease)   . Headache   . Hyperlipidemia   . Hypertension   . Retinal detachment    right eye  . Stroke [facility])    " mild"  . Wears glasses    Past Surgical History:  Procedure Laterality Date  . COLONOSCOPY    . EYE SURGERY Right 11/25/2019   SBP+PPV for RD repair - Dr. [name]  . GAS/FLUID EXCHANGE Right 11/25/2019   Procedure: Gas/Fluid Exchange;  Surgeon: [name], MD;  Location: [place];  Service: Ophthalmology;  Laterality: Right;  . JOINT REPLACEMENT     B/L  . LIPOMA RESECTION     Dr. [name]  . PHOTOCOAGULATION Right 11/25/2019   Procedure: Photocoagulation;  Surgeon: [name], MD;  Location: [place];  Service: Ophthalmology;  Laterality: Right;  . REPLACEMENT TOTAL KNEE  2010   Right, Dr. [name]  . REPLACEMENT TOTAL KNEE  2013   left  . RETINAL DETACHMENT SURGERY Right 11/25/2019   SBP+PPV - Dr. [name]  . VITRECTOMY 25 GAUGE WITH SCLERAL BUCKLE Right 11/25/2019   Procedure: VITRECTOMY 25 GAUGE WITH SCLERAL BUCKLE;  Surgeon: [name], MD;  Location: [place];  Service: Ophthalmology;  Laterality: Right;    FAMILY HISTORY Family History  Problem Relation Age of Onset  . Stroke Mother   . Hypertension Mother   . Diabetes Sister   . Arthritis Maternal Aunt     SOCIAL HISTORY Social History   Tobacco Use  . Smoking status: Never Smoker  . Smokeless tobacco:  Never Used  Substance Use Topics  . Alcohol use: Yes    Comment: occassionaly  . Drug use: Never         OPHTHALMIC EXAM:  Base Eye Exam    Visual Acuity (Snellen - Linear)      Right Left   Dist cc 20/150 -2 20/20 -1   Dist ph cc 20/100 +2    Correction: Glasses       Tonometry (Tonopen, 10:31 AM)      Right Left  Pressure 15 15       Pupils      Shape React APD   Right Irregular Minimal None   Left Round Brisk None       Visual Fields      Left Right    Full Full       Extraocular Movement      Right Left    Full Full       Neuro/Psych    Oriented x3: Yes   Mood/Affect: Normal       Dilation    Both eyes: 1.0% Mydriacyl, 2.5% Phenylephrine @ 10:31 AM        Slit Lamp and Fundus Exam    Slit Lamp Exam      Right Left   Lids/Lashes Dermatochalasis - upper lid Dermatochalasis - upper lid   Conjunctiva/Sclera White and quiet, sutures dissolving White and quiet   Cornea No epi defect, 1+ Punctate epithelial erosions, mild corneal haze 2+Punctate epithelial erosions   Anterior Chamber Deep and quiet Deep and quiet   Iris Round and dilated, irregular, focal Posterior synechiae at 0400 Round and dilated, No NVI   Lens 3+ Nuclear sclerosis, 3+ Cortical cataract, 3+PSC 2-3+ Nuclear sclerosis, 2-3+ Cortical cataract   Vitreous post vitrectomy, gas bubble gone Vitreous syneresis, Posterior vitreous detachment       Fundus Exam      Right Left   Disc very hazy view, mild Pallor, Sharp rim Pink and Sharp   C/D Ratio 0.2 0.2   Macula Hazy view; Flat/reattached, RPE mottling, focal punctate PED superior to macula, no heme Flat, Blunted foveal reflex, Retinal pigment epithelial mottling, No heme or edema   Vessels Vascular attenuation, Tortuosity Vascular attenuation, mild Tortuousity   Periphery retina attached over buckle, good buckle height, good laser over buckle 360; ORIGINALLY: Inferior detachment from 0200-1030, Subretinal band IT midzone, ?small tear at  1030 Attached, 2 focal areas of pigment clumping temporally, No RT/RD on 360 scleral depression         Refraction    Wearing Rx      Sphere Cylinder Axis Add   Right -0.50 +1.75 161 +2.25   Left -1.50 +1.00 002 +2.25          IMAGING AND PROCEDURES  Imaging and Procedures for _0 @  OCT, Retina - OU - Both Eyes       Right Eye Quality was borderline. Central Foveal Thickness: 305. Progression has worsened. Findings include no SRF, outer retinal atrophy, epiretinal membrane, abnormal foveal contour, intraretinal fluid (interval progression of ERM with inferior retinal thickening and IRF, +PVR).   Left Eye Quality was good. Central Foveal Thickness: 248. Progression has been stable. Findings include normal foveal contour, no IRF, no SRF.   Notes *Images captured and stored on drive  Diagnosis / Impression:  OD: retina re-attached -- interval progression of ERM with inferior retinal thickening and IRF-- +PVR OS: NFP, no IRF/SRF  Clinical management:  See below  Abbreviations: NFP - Normal foveal profile. CME - cystoid macular edema. PED - pigment epithelial detachment. IRF - intraretinal fluid. SRF - subretinal fluid. EZ - ellipsoid zone. ERM - epiretinal membrane. ORA - outer retinal atrophy. ORT - outer retinal tubulation. SRHM - subretinal hyper-reflective material                 ASSESSMENT/PLAN:    ICD-10-CM   1. Right retinal detachment  H33.21   2. Hypertensive retinopathy of both eyes  H35.033   3. 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 Essential hypertension  I10   5. Combined forms of age-related cataract of both eyes  H25.813     1,2. 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 - pt unsure of symptom onset - detached inferiorly from 0200 to 1030, fovea off, ?Small tear at 1030 -- no obvious large tear - s/pSBP + PPV/PFC/EL/FAX/14% C3F8 OD,  03.18.21 - intra op - atrophic peripheral retina w/ microtears -- no frank/large tear - retina attached -- good buckle height and laser around breaks - cornea doing well without BCL -- no dendritic lesions but still 1+ PEE and mild corneal haze  - progressive ERM w/ retinal thickening inferiorly -- early PVR  - discussed findings and likely need for PPV w/ membrane peel, but will need cataract surgery first - IOP okay at 15 - gas bubble now gone - start PF taper -- 3,2,1 drops daily, decrease weekly  - stop Brimonidine and PSO - cont valacyclovir 1051m TID for possible herpetic component to corneal pathology - post op drop instructions reviewed - tylenol/ibuprofen for pain - f/u 4-5 weeks, POV  3,4. Hypertensive retinopathy OU - discussed importance of tight BP control - monitor  5. Mixed form age related cataracts OU - The symptoms of cataract, surgical options, and treatments and risks were discussed with patient. - discussed diagnosis and progression - specifically discussed progression of cataract OD following RD repair - OD with significant PSC             - pt requests referral to Dr. BTalbert Forestfor cataract surgery   - will make urgent referral request to try to have cataract removed to aid in impending PPV for ERM/PVR OD   Ophthalmic Meds Ordered this visit:  No orders of the defined types were placed in this encounter.      Return for f/u 4-5 weeks, RD OD, DFE, OCT.  There are no Patient Instructions on file for this visit.   Explained the diagnoses, plan, and follow up with the patient and they expressed understanding.  Patient expressed understanding of the importance of proper follow up care.   BGardiner Sleeper M.D., Ph.D. Diseases & Surgery of the Retina and [place] [place]06/03/2020   I have reviewed the above documentation for accuracy and completeness, and I agree with the above. BGardiner Sleeper M.D., Ph.D. 02/14/20 12:31 PM    Abbreviations: M myopia (nearsighted); A astigmatism; H hyperopia (farsighted); P presbyopia; Mrx spectacle prescription;  CTL contact lenses; OD right eye; OS left eye; OU both eyes  XT exotropia; ET esotropia; PEK punctate epithelial keratitis; PEE punctate epithelial erosions; DES dry eye syndrome; MGD meibomian gland dysfunction; ATs artificial tears; PFAT's preservative free artificial tears; NOrdnuclear sclerotic cataract; PSC posterior subcapsular cataract; ERM epi-retinal membrane; PVD posterior vitreous detachment; RD retinal detachment; DM diabetes mellitus; DR diabetic retinopathy; NPDR non-proliferative diabetic retinopathy; PDR proliferative diabetic retinopathy; CSME clinically significant macular edema; DME diabetic macular edema; dbh dot blot hemorrhages; CWS cotton wool spot; POAG primary open angle glaucoma; C/D cup-to-disc ratio; HVF humphrey visual field; GVF goldmann visual field; OCT optical coherence tomography; IOP intraocular pressure; BRVO Branch retinal vein occlusion; CRVO central retinal vein occlusion; CRAO central retinal artery occlusion; BRAO branch retinal artery occlusion; RT retinal tear; SB scleral buckle; PPV pars plana vitrectomy; VH Vitreous hemorrhage; PRP panretinal laser photocoagulation; IVK intravitreal kenalog; VMT vitreomacular traction; MH  Macular hole;  NVD neovascularization of the disc; NVE neovascularization elsewhere; AREDS age related eye disease study; ARMD age related macular degeneration; POAG primary open angle glaucoma; EBMD epithelial/anterior basement membrane dystrophy; ACIOL anterior chamber intraocular lens; IOL intraocular lens; PCIOL posterior chamber intraocular lens; Phaco/IOL phacoemulsification with intraocular lens placement; [place] photorefractive  keratectomy; LASIK laser assisted in situ keratomileusis; HTN hypertension; DM diabetes mellitus; COPD chronic obstructive pulmonary disease

## 2020-02-14 ENCOUNTER — Encounter (INDEPENDENT_AMBULATORY_CARE_PROVIDER_SITE_OTHER): Payer: Self-pay | Admitting: Ophthalmology

## 2020-02-14 ENCOUNTER — Other Ambulatory Visit: Payer: Self-pay

## 2020-02-14 ENCOUNTER — Ambulatory Visit (INDEPENDENT_AMBULATORY_CARE_PROVIDER_SITE_OTHER): Payer: Medicare PPO | Admitting: Ophthalmology

## 2020-02-14 DIAGNOSIS — H25813 Combined forms of age-related cataract, bilateral: Secondary | ICD-10-CM

## 2020-02-14 DIAGNOSIS — I10 Essential (primary) hypertension: Secondary | ICD-10-CM

## 2020-02-14 DIAGNOSIS — H3581 Retinal edema: Secondary | ICD-10-CM

## 2020-02-14 DIAGNOSIS — H3321 Serous retinal detachment, right eye: Secondary | ICD-10-CM

## 2020-02-14 DIAGNOSIS — H35033 Hypertensive retinopathy, bilateral: Secondary | ICD-10-CM

## 2020-02-14 NOTE — Progress Notes (Deleted)
Pt states the gas bubble has been gone since last Tuesday    1,2. Retinal detachment, right eye             - bullous inferior mac off detachment -- likely chronic - pt unsure of symptom onset             - detached inferiorly from 0200 to 1030, fovea off, ?Small tear at 1030 -- no obvious large tear             - s/pSBP + PPV/PFC/EL/FAX/14% C3F8 OD, 03.18.21             - intra op - atrophic peripheral retina w/ microtears -- no frank/large tear - retina attachedand in good position -- good buckle height and laser around breaks             - cornea doing well without BCL -- no dendritic lesions but still 1+ PEE and mild corneal haze - IOP okay at 15             - gas bubble now gone - cont PF 4x/day OD Brimonidine Qdaily PSO ung QID OD-- okay to stop             - cont valacyclovir 1063m TID for possible herpetic component to corneal pathology - post op drop instructions reviewed - tylenol/ibuprofen for pain             - f/u 4-5 weeks, POV  3,4. Hypertensive retinopathy OU             - discussed importance of tight BP control             - monitor  5. Mixed form age related cataracts OU             - The symptoms of cataract, surgical options, and treatments and risks were discussed with patient.             - discussed diagnosis and progression             - specifically discussed likelihood of progression of cataract OD following RD repair             - OD with significant PSC  - pt prefers to see Dr. BTalbert Forestfor cataract surgery - will make urgent referral

## 2020-02-23 ENCOUNTER — Telehealth (INDEPENDENT_AMBULATORY_CARE_PROVIDER_SITE_OTHER): Payer: Self-pay

## 2020-03-17 NOTE — Progress Notes (Addendum)
[facility] Note  03/20/2020     CHIEF COMPLAINT Patient presents for Retina Follow Up   HISTORY OF PRESENT ILLNESS: Mariah Cortez is a 69 y.o. female who presents to the clinic today for:   HPI    Retina Follow Up    Patient presents with  Retinal Break/Detachment.  In right eye.  Duration of 4 months.  Since onset it is stable.  I, the attending physician,  performed the HPI with the patient and updated documentation appropriately.          Comments    RD sx OD 11/25/2019- Unsure if vision is worse since last visit "but it's bad"  She is scheduled for cataract sx OD 03/27/2020 and she believes 04/10/2020 OS. She is still taking Prednisolone OD qd.   BS: 164 this morning,  A1C: 7.2        Last edited by [name], MD on 03/20/2020 12:18 PM. (History)    pt states she had an appt with Dr. Talbert Forest and is scheduled for surgery on her right eye on March 27, 2020 and left eye August 2  Referring physician: [name], MD [place] [facility] [place]  [place] [id number]  HISTORICAL INFORMATION:   Selected notes from the MEDICAL RECORD NUMBER Referred by Dr. Orion Modest for concern of mac off RD   CURRENT MEDICATIONS: Current Outpatient Medications (Ophthalmic Drugs)  Medication Sig  . bacitracin-polymyxin b (POLYSPORIN) ophthalmic ointment Place into the right eye at bedtime. Place a 1/2 inch ribbon of ointment into the lower eyelid. (Patient taking differently: Place 1 application into the right eye 4 (four) times daily. Place a 1/2 inch ribbon of ointment into the lower eyelid.)  . brimonidine (ALPHAGAN) 0.2 % ophthalmic solution Place 1 drop into the right eye in the morning and at bedtime.  [name] gatifloxacin (ZYMAXID) 0.5 % SOLN INSTILL ONE DROP INTO THE RIGHT EYE FOUR TIMES DAILY  . prednisoLONE acetate (PRED FORTE) 1 % ophthalmic suspension Place 1 drop into the right eye 4 (four) times daily.   Current  Facility-Administered Medications (Ophthalmic Drugs)  Medication Route  . gatifloxacin (ZYMAXID) 0.5 % ophthalmic drops 1 drop Right Eye   Current Outpatient Medications (Other)  Medication Sig  . APPLE CIDER VINEGAR PO Take 450 mg by mouth daily.  . Ascorbic Acid (VITAMIN C CR) 1000 MG TBCR Take 1,000 mg by mouth daily.   [name] aspirin 81 MG tablet Take 81 mg by mouth daily.    . cetirizine (ZYRTEC) 10 MG tablet Take 10 mg by mouth daily.    . Cholecalciferol (VITAMIN D3) 50 MCG (2000 UT) TABS Take 2,000 mg by mouth daily.  . Cysteamine Bitartrate (PROCYSBI) 300 MG PACK by XX route as directed  . esomeprazole (NEXIUM) 20 MG capsule Take 20 mg by mouth daily as needed (Heartburn).  . Ferrous Sulfate (IRON) 325 (65 FE) MG TABS Take 325 mg by mouth in the morning and at bedtime.   [name] glimepiride (AMARYL) 4 MG tablet Take 4 mg by mouth 2 (two) times daily.  . hydrochlorothiazide (HYDRODIURIL) 25 MG tablet Take 25 mg by mouth daily.  [name] HYDROcodone-acetaminophen (NORCO/VICODIN) 5-325 MG tablet Take 1 tablet by mouth every 4 (four) hours as needed for moderate pain.  [name] losartan (COZAAR) 50 MG tablet Take 50 mg by mouth daily.  [name] lovastatin (MEVACOR) 40 MG tablet Take 40 mg by mouth 2 (two) times daily.    [name]  meloxicam (MOBIC) 7.5 MG tablet Take 7.5 mg by mouth daily.  . metFORMIN (GLUCOPHAGE) 1000 MG tablet Take 1,000 mg by mouth in the morning and at bedtime.   . Misc Natural Products (OSTEO BI-FLEX JOINT SHIELD PO) Take 1 tablet by mouth in the morning and at bedtime.   . Multiple Vitamin (MULTIVITAMIN) capsule Take 1 capsule by mouth daily.    . pantoprazole (PROTONIX) 40 MG tablet Take 40 mg by mouth daily.  . pioglitazone (ACTOS) 30 MG tablet Take 30 mg by mouth daily.  . Potassium 99 MG TABS Take 99 mg by mouth daily.  . traMADol (ULTRAM) 50 MG tablet Take 50 mg by mouth 2 (two) times daily. Additional  50 mg if needed  . valACYclovir (VALTREX) 1000 MG tablet Take 1 tablet (1,000 mg total) by  mouth 3 (three) times daily.  . vitamin B-12 (CYANOCOBALAMIN) 1000 MCG tablet Take 1,000 mcg by mouth daily.   No current facility-administered medications for this visit. (Other)      REVIEW OF SYSTEMS: ROS    Positive for: Gastrointestinal, Cardiovascular, Eyes   Negative for: Constitutional, Neurological, Skin, Genitourinary, Musculoskeletal, HENT, Endocrine, Respiratory, Psychiatric, Allergic/Imm, Heme/Lymph   Last edited by Leonie Douglas, COA on 03/20/2020 10:09 AM. (History)       ALLERGIES Allergies  Allergen Reactions  . Latex Rash    Latex tape  . Morphine And Related Itching    PAST MEDICAL HISTORY Past Medical History:  Diagnosis Date  . Allergies   . Arthritis   . Cataracts, bilateral   . Diabetes mellitus   . GERD (gastroesophageal reflux disease)   . Headache   . Hyperlipidemia   . Hypertension   . Retinal detachment    right eye  . Stroke [facility])    " mild"  . Wears glasses    Past Surgical History:  Procedure Laterality Date  . COLONOSCOPY    . EYE SURGERY Right 11/25/2019   SBP+PPV for RD repair - Dr. [name]  . GAS/FLUID EXCHANGE Right 11/25/2019   Procedure: Gas/Fluid Exchange;  Surgeon: [name], MD;  Location: [facility];  Service: Ophthalmology;  Laterality: Right;  . JOINT REPLACEMENT     B/L  . LIPOMA RESECTION     Dr. [name]  . PHOTOCOAGULATION Right 11/25/2019   Procedure: Photocoagulation;  Surgeon: [name], MD;  Location: [facility];  Service: Ophthalmology;  Laterality: Right;  . REPLACEMENT TOTAL KNEE  2010   Right, Dr. [name]  . REPLACEMENT TOTAL KNEE  2013   left  . RETINAL DETACHMENT SURGERY Right 11/25/2019   SBP+PPV - Dr. [name]  . VITRECTOMY 25 GAUGE WITH SCLERAL BUCKLE Right 11/25/2019   Procedure: VITRECTOMY 25 GAUGE WITH SCLERAL BUCKLE;  Surgeon: [name], MD;  Location: [facility];  Service: Ophthalmology;  Laterality: Right;    FAMILY HISTORY Family History  Problem Relation Age of Onset  . Stroke Mother    . Hypertension Mother   . Diabetes Sister   . Arthritis Maternal Aunt     SOCIAL HISTORY Social History   Tobacco Use  . Smoking status: Never Smoker  . Smokeless tobacco: Never Used  Vaping Use  . Vaping Use: Never used  Substance Use Topics  . Alcohol use: Yes    Comment: occassionaly  . Drug use: Never         OPHTHALMIC EXAM:  Base Eye Exam    Visual Acuity (Snellen - Linear)      Right Left   Dist  cc 20/250 20/20   Dist ph cc 20/150 +1    Correction: Glasses       Tonometry (Tonopen, 10:18 AM)      Right Left   Pressure 19 18       Pupils      Dark Light Shape React APD   Right 3 2 Round Minimal None   Left 3 2 Round Minimal None       Visual Fields (Counting fingers)      Left Right    Full    Restrictions  Partial outer superior temporal deficiency       Extraocular Movement      Right Left    Full Full       Neuro/Psych    Oriented x3: Yes   Mood/Affect: Normal       Dilation    Both eyes: 1.0% Mydriacyl, 2.5% Phenylephrine @ 10:19 AM        Slit Lamp and Fundus Exam    Slit Lamp Exam      Right Left   Lids/Lashes Dermatochalasis - upper lid Dermatochalasis - upper lid   Conjunctiva/Sclera White and quiet, sutures dissolving White and quiet   Cornea No epi defect, 1+ Punctate epithelial erosions, mild corneal haze 2+Punctate epithelial erosions   Anterior Chamber Deep and quiet Deep and quiet   Iris Round and dilated, irregular, focal Posterior synechiae at 0400 Round and dilated, No NVI   Lens 3+ Nuclear sclerosis with brunescence, 3+ Cortical cataract, 3-4+PSC 2-3+ Nuclear sclerosis, 2-3+ Cortical cataract   Vitreous post vitrectomy Vitreous syneresis, Posterior vitreous detachment       Fundus Exam      Right Left   Disc very hazy view, mild Pallor, Sharp rim Pink and Sharp   C/D Ratio 0.2 0.2   Macula Hazy view; Flat/reattached, RPE mottling, focal punctate PED superior to macula, no heme Flat, Blunted foveal reflex,  Retinal pigment epithelial mottling, No heme or edema   Vessels Vascular attenuation, Tortuosity Vascular attenuation, mild Tortuousity   Periphery retina attached over buckle, good buckle height, good laser over buckle 360; ORIGINALLY: Inferior detachment from 0200-1030, Subretinal band IT midzone, ?small tear at 1030 Attached, 2 focal areas of pigment clumping temporally, No RT/RD on 360 exam          IMAGING AND PROCEDURES  Imaging and Procedures for _0 @  OCT, Retina - OU - Both Eyes       Right Eye Quality was borderline. Central Foveal Thickness: 298. Progression has been stable. Findings include no SRF, outer retinal atrophy, epiretinal membrane, abnormal foveal contour, intraretinal fluid (inferior PVR settling/stablizing).   Left Eye Quality was good. Central Foveal Thickness: 248. Progression has been stable. Findings include normal foveal contour, no IRF, no SRF.   Notes *Images captured and stored on drive  Diagnosis / Impression:  OD: retina re-attached; +ERM; inferior peripheral PVR settling/stablizing OS: NFP, no IRF/SRF  Clinical management:  See below  Abbreviations: NFP - Normal foveal profile. CME - cystoid macular edema. PED - pigment epithelial detachment. IRF - intraretinal fluid. SRF - subretinal fluid. EZ - ellipsoid zone. ERM - epiretinal membrane. ORA - outer retinal atrophy. ORT - outer retinal tubulation. SRHM - subretinal hyper-reflective material                 ASSESSMENT/PLAN:    ICD-10-CM   1. Right retinal detachment  H33.21   2. Retinal edema  H35.81 OCT, Retina - OU - Both Eyes  3.  Essential hypertension  I10   4. Hypertensive retinopathy of both eyes  H35.033   5. Combined forms of age-related cataract of both eyes  H25.813     1,2. 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 - pt unsure of symptom onset - detached inferiorly from 0200 to 1030, fovea off,  ?Small tear at 1030 -- no obvious large tear - s/pSBP + PPV/PFC/EL/FAX/14% C3F8 OD, 03.18.21 - intra op - atrophic peripheral retina w/ microtears -- no frank/large tear - retina attached -- good buckle height and laser around breaks - cornea doing well without BCL -- no dendritic lesions but still 1+ PEE and mild corneal haze  - ERM w/ retinal thickening inferiorly -- inferior PVR settling/stablizing  - discussed findings and possible need for PPV w/ membrane peel, but will need cataract surgery first - IOP ok at 19 - completed PF taper  - cont valacyclovir 1014m TID for possible herpetic component to corneal pathology - post op drop instructions reviewed - tylenol/ibuprofen for pain  - f/u 6 weeks, POV  3,4. Hypertensive retinopathy OU - discussed importance of tight BP control - monitor  5. Mixed form age related cataracts OU - The symptoms of cataract, surgical options, and treatments and risks were discussed with patient. - discussed diagnosis and progression - specifically discussed progression of cataract OD following RD repair  - OD with significant PSC             - pt is schedule for cataract surgery with Dr. BTalbert Foreston March 27, 2020   Ophthalmic Meds Ordered this visit:  No orders of the defined types were placed in this encounter.      Return in about 6 weeks (around 05/01/2020) for f/u RD OD, DFE, OCT.  There are no Patient Instructions on file for this visit.   Explained the diagnoses, plan, and follow up with the patient and they expressed understanding.  Patient expressed understanding of the importance of proper follow up care.   This document serves as a record of services personally performed by BGardiner Sleeper MD, PhD. It was created on their behalf by RLeonie Douglas  an ophthalmic technician. The creation of this record is the provider's dictation and/or activities during the visit.    Electronically signed by: RLeonie DouglasCOA, 03/20/20  12:22 PM   This document serves as a record of services personally performed by BGardiner Sleeper MD, PhD. It was created on their behalf by ASan Jetty BOwens Shark OA an ophthalmic technician. The creation of this record is the provider's dictation and/or activities during the visit.    Electronically signed by: ASan Jetty BOwens Shark [place]07.12.2021 12:22 PM  BGardiner Sleeper M.D., Ph.D. Diseases & Surgery of the Retina and Vitreous [facility] I have reviewed the above documentation for accuracy and completeness, and I agree with the above. BGardiner Sleeper M.D., Ph.D. 03/20/20 12:22 PM   Abbreviations: M myopia (nearsighted); A astigmatism; H hyperopia (farsighted); P presbyopia; Mrx spectacle prescription;  CTL contact lenses; OD right eye; OS left eye; OU both eyes  XT exotropia; ET esotropia; PEK punctate epithelial keratitis; PEE punctate epithelial erosions; DES dry eye syndrome; MGD meibomian gland dysfunction; ATs artificial tears; PFAT's preservative free artificial tears; [place] cataract; PSC posterior subcapsular cataract; ERM epi-retinal membrane; PVD posterior vitreous detachment; RD retinal detachment; DM diabetes mellitus; DR diabetic retinopathy; NPDR non-proliferative diabetic retinopathy; PDR proliferative diabetic retinopathy; CSME clinically  significant macular edema; DME diabetic macular edema; dbh dot blot hemorrhages; CWS cotton wool spot; POAG primary open angle glaucoma; C/D cup-to-disc ratio; HVF humphrey visual field; GVF goldmann visual field; OCT optical coherence tomography; IOP intraocular pressure; BRVO Branch retinal vein occlusion; CRVO central retinal vein occlusion; CRAO central retinal artery occlusion; BRAO branch retinal artery occlusion; RT retinal tear; SB  scleral buckle; PPV pars plana vitrectomy; VH Vitreous hemorrhage; PRP panretinal laser photocoagulation; IVK intravitreal kenalog; VMT vitreomacular traction; MH Macular hole;  NVD neovascularization of the disc; NVE neovascularization elsewhere; AREDS age related eye disease study; ARMD age related macular degeneration; POAG primary open angle glaucoma; EBMD epithelial/anterior basement membrane dystrophy; ACIOL anterior chamber intraocular lens; IOL intraocular lens; PCIOL posterior chamber intraocular lens; Phaco/IOL phacoemulsification with intraocular lens placement; [place] photorefractive keratectomy; LASIK laser assisted in situ keratomileusis; HTN hypertension; DM diabetes mellitus; COPD chronic obstructive pulmonary disease

## 2020-03-20 ENCOUNTER — Other Ambulatory Visit: Payer: Self-pay

## 2020-03-20 ENCOUNTER — Encounter (INDEPENDENT_AMBULATORY_CARE_PROVIDER_SITE_OTHER): Payer: Self-pay | Admitting: Ophthalmology

## 2020-03-20 ENCOUNTER — Ambulatory Visit (INDEPENDENT_AMBULATORY_CARE_PROVIDER_SITE_OTHER): Payer: Medicare PPO | Admitting: Ophthalmology

## 2020-03-20 DIAGNOSIS — H3581 Retinal edema: Secondary | ICD-10-CM

## 2020-03-20 DIAGNOSIS — H3321 Serous retinal detachment, right eye: Secondary | ICD-10-CM

## 2020-03-20 DIAGNOSIS — H35033 Hypertensive retinopathy, bilateral: Secondary | ICD-10-CM | POA: Diagnosis not present

## 2020-03-20 DIAGNOSIS — I10 Essential (primary) hypertension: Secondary | ICD-10-CM | POA: Diagnosis not present

## 2020-03-20 DIAGNOSIS — H25813 Combined forms of age-related cataract, bilateral: Secondary | ICD-10-CM

## 2020-04-27 NOTE — Progress Notes (Signed)
[facility] Note  05/02/2020     CHIEF COMPLAINT Patient presents for Retina Follow Up   HISTORY OF PRESENT ILLNESS: Mariah Cortez is a 69 y.o. female who presents to the clinic today for:   HPI    Retina Follow Up    Patient presents with  Retinal Break/Detachment.  In right eye.  This started 6 weeks ago.  Severity is moderate.  I, the attending physician,  performed the HPI with the patient and updated documentation appropriately.          Comments    Patient here for 6 weeks retina follow up for RD OD. Patient states vision doing better. Used gtts last night felt like a blob on OS. No eye pain. Used gtts this am.       Last edited by [name], MD on 05/02/2020  1:09 PM. (History)    pt had cataract sx with Dr. Talbert Forest (OD: 07.19.21, OS: 08.09.21) she states the sx went well, but is upset with the puffiness under eye since then, she states Dr. Lucita Ferrara told her that tissue from the back of her eye "broke loose" and came to the front of her eye due to having 2 sx close together, she is on post drops OS only (gatifloxican QID, Durezol Qdaily, Prolensa QHS), her next appt with Dr. Talbert Forest is September 14   Referring physician: Anell Barr, OD [place],  [place] [address]  HISTORICAL INFORMATION:   Selected notes from the MEDICAL RECORD NUMBER Referred by Dr. Orion Modest for concern of mac off RD   CURRENT MEDICATIONS: Current Outpatient Medications (Ophthalmic Drugs)  Medication Sig  . bacitracin-polymyxin b (POLYSPORIN) ophthalmic ointment Place into the right eye at bedtime. Place a 1/2 inch ribbon of ointment into the lower eyelid. (Patient taking differently: Place 1 application into the right eye 4 (four) times daily. Place a 1/2 inch ribbon of ointment into the lower eyelid.)  . brimonidine (ALPHAGAN) 0.2 % ophthalmic solution Place 1 drop into the right eye in the morning and at bedtime.  [name] gatifloxacin (ZYMAXID) 0.5 % SOLN  INSTILL ONE DROP INTO THE RIGHT EYE FOUR TIMES DAILY  . prednisoLONE acetate (PRED FORTE) 1 % ophthalmic suspension Place 1 drop into the right eye 4 (four) times daily.   Current Facility-Administered Medications (Ophthalmic Drugs)  Medication Route  . gatifloxacin (ZYMAXID) 0.5 % ophthalmic drops 1 drop Right Eye   Current Outpatient Medications (Other)  Medication Sig  . APPLE CIDER VINEGAR PO Take 450 mg by mouth daily.  . Ascorbic Acid (VITAMIN C CR) 1000 MG TBCR Take 1,000 mg by mouth daily.   [name] aspirin 81 MG tablet Take 81 mg by mouth daily.    . cetirizine (ZYRTEC) 10 MG tablet Take 10 mg by mouth daily.    . Cholecalciferol (VITAMIN D3) 50 MCG (2000 UT) TABS Take 2,000 mg by mouth daily.  . Cysteamine Bitartrate (PROCYSBI) 300 MG PACK by XX route as directed  . esomeprazole (NEXIUM) 20 MG capsule Take 20 mg by mouth daily as needed (Heartburn).  . Ferrous Sulfate (IRON) 325 (65 FE) MG TABS Take 325 mg by mouth in the morning and at bedtime.   [name] glimepiride (AMARYL) 4 MG tablet Take 4 mg by mouth 2 (two) times daily.  . hydrochlorothiazide (HYDRODIURIL) 25 MG tablet Take 25 mg by mouth daily.  [name] HYDROcodone-acetaminophen (NORCO/VICODIN) 5-325 MG tablet Take 1 tablet by mouth every 4 (four)  hours as needed for moderate pain.  [name] losartan (COZAAR) 50 MG tablet Take 50 mg by mouth daily.  [name] lovastatin (MEVACOR) 40 MG tablet Take 40 mg by mouth 2 (two) times daily.    . meloxicam (MOBIC) 7.5 MG tablet Take 7.5 mg by mouth daily.  . metFORMIN (GLUCOPHAGE) 1000 MG tablet Take 1,000 mg by mouth in the morning and at bedtime.   . Misc Natural Products (OSTEO BI-FLEX JOINT SHIELD PO) Take 1 tablet by mouth in the morning and at bedtime.   . Multiple Vitamin (MULTIVITAMIN) capsule Take 1 capsule by mouth daily.    . pantoprazole (PROTONIX) 40 MG tablet Take 40 mg by mouth daily.  . pioglitazone (ACTOS) 30 MG tablet Take 30 mg by mouth daily.  . Potassium 99 MG TABS Take 99 mg by mouth daily.   . traMADol (ULTRAM) 50 MG tablet Take 50 mg by mouth 2 (two) times daily. Additional  50 mg if needed  . valACYclovir (VALTREX) 1000 MG tablet Take 1 tablet (1,000 mg total) by mouth 3 (three) times daily.  . vitamin B-12 (CYANOCOBALAMIN) 1000 MCG tablet Take 1,000 mcg by mouth daily.   No current facility-administered medications for this visit. (Other)   REVIEW OF SYSTEMS: ROS    Positive for: Gastrointestinal, Cardiovascular, Eyes   Negative for: Constitutional, Neurological, Skin, Genitourinary, Musculoskeletal, HENT, Endocrine, Respiratory, Psychiatric, Allergic/Imm, Heme/Lymph   Last edited by Theodore Demark, COA on 05/02/2020 10:04 AM. (History)     ALLERGIES Allergies  Allergen Reactions  . Latex Rash    Latex tape  . Morphine And Related Itching   PAST MEDICAL HISTORY Past Medical History:  Diagnosis Date  . Allergies   . Arthritis   . Cataracts, bilateral   . Diabetes mellitus   . GERD (gastroesophageal reflux disease)   . Headache   . Hyperlipidemia   . Hypertension   . Retinal detachment    right eye  . Stroke [facility])    " mild"  . Wears glasses    Past Surgical History:  Procedure Laterality Date  . COLONOSCOPY    . EYE SURGERY Right 11/25/2019   SBP+PPV for RD repair - Dr. [name]  . GAS/FLUID EXCHANGE Right 11/25/2019   Procedure: Gas/Fluid Exchange;  Surgeon: [name], MD;  Location: [place];  Service: Ophthalmology;  Laterality: Right;  . JOINT REPLACEMENT     B/L  . LIPOMA RESECTION     Dr. [name]  . PHOTOCOAGULATION Right 11/25/2019   Procedure: Photocoagulation;  Surgeon: [name], MD;  Location: [place];  Service: Ophthalmology;  Laterality: Right;  . REPLACEMENT TOTAL KNEE  2010   Right, Dr. [name]  . REPLACEMENT TOTAL KNEE  2013   left  . RETINAL DETACHMENT SURGERY Right 11/25/2019   SBP+PPV - Dr. [name]  . VITRECTOMY 25 GAUGE WITH SCLERAL BUCKLE Right 11/25/2019   Procedure: VITRECTOMY 25 GAUGE WITH SCLERAL BUCKLE;   Surgeon: [name], MD;  Location: [place];  Service: Ophthalmology;  Laterality: Right;   FAMILY HISTORY Family History  Problem Relation Age of Onset  . Stroke Mother   . Hypertension Mother   . Diabetes Sister   . Arthritis Maternal Aunt    SOCIAL HISTORY Social History   Tobacco Use  . Smoking status: Never Smoker  . Smokeless tobacco: Never Used  Vaping Use  . Vaping Use: Never used  Substance Use Topics  . Alcohol use: Yes    Comment: occassionaly  . Drug use: Never  OPHTHALMIC EXAM:  Base Eye Exam    Visual Acuity (Snellen - Linear)      Right Left   Dist cc 20/70 -2 20/20   Dist ph cc 20/50    Correction: Glasses       Tonometry (Tonopen, 10:01 AM)      Right Left   Pressure 20 21       Pupils      Dark Light Shape React APD   Right 4 3 Round Brisk None   Left 3 2 Round Brisk None       Visual Fields      Left Right    Full    Restrictions  Partial outer superior temporal deficiency       Extraocular Movement      Right Left    Full Full       Neuro/Psych    Oriented x3: Yes   Mood/Affect: Normal       Dilation    Both eyes: 1.0% Mydriacyl, 2.5% Phenylephrine @ 10:00 AM        Slit Lamp and Fundus Exam    Slit Lamp Exam      Right Left   Lids/Lashes Dermatochalasis - upper lid Dermatochalasis - upper lid   Conjunctiva/Sclera White and quiet, sutures dissolving White and quiet   Cornea No epi defect, trace Punctate epithelial erosions, mild corneal haze, well healed temporal cataract wounds 1+Punctate epithelial erosions, well healed temporal cataract wounds   Anterior Chamber Deep and quiet Deep and quiet   Iris Round and dilated, irregular, focal Posterior synechiae at 0400 Round and dilated, No NVI   Lens PC IOL in good position, 1-2+ fibrotic Posterior capsular opacification PC IOL in good position   Vitreous post vitrectomy Vitreous syneresis, Posterior vitreous detachment       Fundus Exam      Right Left   Disc  Pink and Sharp Pink and Sharp   C/D Ratio 0.2 0.2   Macula Flat/reattached, blunted foveal reflex, RPE mottling, focal punctate PED superior to macula, no heme Flat, Blunted foveal reflex, Retinal pigment epithelial mottling, No heme or edema   Vessels Vascular attenuation, Tortuosity Vascular attenuation, mild Tortuousity   Periphery retina attached over buckle, good buckle height, good laser over buckle 360; mild ERM and fibrosis inferiorly ?improving ORIGINALLY: Inferior detachment from 0200-1030, Subretinal band IT midzone, ?small tear at 1030 Attached, 2 focal areas of pigment clumping temporally, No RT/RD on 360 exam        Refraction    Wearing Rx      Sphere Cylinder Axis Add   Right -0.50 +1.75 161 +2.25   Left -1.50 +1.00 002 +2.25          IMAGING AND PROCEDURES  Imaging and Procedures for _0 @  OCT, Retina - OU - Both Eyes       Right Eye Quality was good. Central Foveal Thickness: 286. Progression has improved. Findings include no SRF, outer retinal atrophy, epiretinal membrane, normal foveal contour, no IRF (Inferior ERM with retinal thickening slightly improved ).   Left Eye Quality was good. Central Foveal Thickness: 246. Progression has been stable. Findings include normal foveal contour, no IRF, no SRF.   Notes *Images captured and stored on drive  Diagnosis / Impression:  OD: retina re-attached; +ERM; Inferior ERM with retinal thickening slightly improved from prior OS: NFP, no IRF/SRF  Clinical management:  See below  Abbreviations: NFP - Normal foveal profile. CME - cystoid macular edema. PED -  pigment epithelial detachment. IRF - intraretinal fluid. SRF - subretinal fluid. EZ - ellipsoid zone. ERM - epiretinal membrane. ORA - outer retinal atrophy. ORT - outer retinal tubulation. SRHM - subretinal hyper-reflective material                 ASSESSMENT/PLAN:    ICD-10-CM   1. Right retinal detachment  H33.21   2. Retinal edema  H35.81 OCT,  Retina - OU - Both Eyes  3. Epiretinal membrane (ERM) of right eye  H35.371   4. Essential hypertension  I10   5. Hypertensive retinopathy of both eyes  H35.033   6. Pseudophakia of both eyes  Z96.1     1,2. 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 - pt unsure of symptom onset - detached inferiorly from 0200 to 1030, fovea off, ?Small tear at 1030 -- no obvious large tear - s/pSBP + PPV/PFC/EL/FAX/14% C3F8 OD, 03.18.21 - intra op - atrophic peripheral retina w/ microtears -- no frank/large tear - retina attached -- good buckle height and laser around breaks - cornea doing well without BCL -- no dendritic lesions but still 1+ PEE and mild corneal haze  - ERM w/ retinal thickening inferiorly -- improved from prior  - discussed findings and possible need for PPV w/ membrane peel  - BCVA 20/50 post cataract surgery OD - IOP ok at 20 - cont valacyclovir 1062m TID for possible herpetic component to corneal pathology - post op drop instructions reviewed - tylenol/ibuprofen for pain  - f/u 3 months, DFE, OCT  3. Epiretinal membrane, right eye  - The natural history, anatomy, potential for loss of vision, and treatment options including vitrectomy techniques and the complications of endophthalmitis, retinal detachment, vitreous hemorrhage, cataract progression and permanent vision loss discussed with the patient. - mild inferior ERM w/ retinal thickening -- improved from prior - very peripheral ERM; no metamorphopsia - BCVA 20/50 OD - no indication for surgery at this time - recommend monitoring for now - f/u 3 mos, sooner prn   4,5. Hypertensive retinopathy OU - discussed importance of tight BP control - monitor  6. Pseudophakia OU  - s/p CE/IOL OU (Dr. BTalbert Forest OD: 07.19.21, OS:  08.09.21)  - IOLs in good position, doing well  - monitor   Ophthalmic Meds Ordered this visit:  No orders of the defined types were placed in this encounter.     Return in about 3 months (around 08/02/2020) for f/u RD OD, DFE, OCT.  There are no Patient Instructions on file for this visit.  Explained the diagnoses, plan, and follow up with the patient and they expressed understanding.  Patient expressed understanding of the importance of proper follow up care.   This document serves as a record of services personally performed by BGardiner Sleeper MD, PhD. It was created on their behalf by AEstill Bakes COT an ophthalmic technician. The creation of this record is the provider's dictation and/or activities during the visit.    Electronically signed by: AEstill Bakes COT 8.19.21 @ 1:13 PM   This document serves as a record of services personally performed by BGardiner Sleeper MD, PhD. It was created on their behalf by ASan Jetty BOwens Shark OA an ophthalmic technician. The creation of this record is the provider's dictation and/or activities during the visit.    Electronically signed by: ASan Jetty BBoaz ONew [place]08.24.2021 1:13 PM  BGardiner Sleeper M.D., Ph.D. Diseases & Surgery of the Retina and Vitreous [facility] 05/02/2020   I have reviewed the above documentation for accuracy and completeness, and I agree with the above. Gardiner Sleeper, M.D., Ph.D. 05/02/20 1:13 PM   Abbreviations: M myopia (nearsighted); A astigmatism; H hyperopia (farsighted); P presbyopia; Mrx spectacle prescription;  CTL contact lenses; OD right eye; OS left eye; OU both eyes  XT exotropia; ET esotropia; PEK punctate epithelial keratitis; PEE punctate epithelial erosions; DES dry eye syndrome; MGD meibomian gland dysfunction; ATs artificial tears; PFAT's preservative free artificial tears; [place] nuclear sclerotic cataract; PSC posterior subcapsular cataract; ERM epi-retinal membrane; PVD posterior  vitreous detachment; RD retinal detachment; DM diabetes mellitus; DR diabetic retinopathy; NPDR non-proliferative diabetic retinopathy; PDR proliferative diabetic retinopathy; CSME clinically significant macular edema; DME diabetic macular edema; dbh dot blot hemorrhages; CWS cotton wool spot; POAG primary open angle glaucoma; C/D cup-to-disc ratio; HVF humphrey visual field; GVF goldmann visual field; OCT optical coherence tomography; IOP intraocular pressure; BRVO Branch retinal vein occlusion; CRVO central retinal vein occlusion; CRAO central retinal artery occlusion; BRAO branch retinal artery occlusion; RT retinal tear; SB scleral buckle; PPV pars plana vitrectomy; VH Vitreous hemorrhage; PRP panretinal laser photocoagulation; IVK intravitreal kenalog; VMT vitreomacular traction; MH Macular hole;  NVD neovascularization of the disc; NVE neovascularization elsewhere; AREDS age related eye disease study; ARMD age related macular degeneration; POAG primary open angle glaucoma; EBMD epithelial/anterior basement membrane dystrophy; ACIOL anterior chamber intraocular lens; IOL intraocular lens; PCIOL posterior chamber intraocular lens; Phaco/IOL phacoemulsification with intraocular lens placement; [place] photorefractive keratectomy; LASIK laser assisted in situ keratomileusis; HTN hypertension; DM diabetes mellitus; COPD chronic obstructive pulmonary disease

## 2020-05-02 ENCOUNTER — Encounter (INDEPENDENT_AMBULATORY_CARE_PROVIDER_SITE_OTHER): Payer: Self-pay | Admitting: Ophthalmology

## 2020-05-02 ENCOUNTER — Other Ambulatory Visit: Payer: Self-pay

## 2020-05-02 ENCOUNTER — Ambulatory Visit (INDEPENDENT_AMBULATORY_CARE_PROVIDER_SITE_OTHER): Payer: Medicare PPO | Admitting: Ophthalmology

## 2020-05-02 DIAGNOSIS — H3581 Retinal edema: Secondary | ICD-10-CM

## 2020-05-02 DIAGNOSIS — Z961 Presence of intraocular lens: Secondary | ICD-10-CM

## 2020-05-02 DIAGNOSIS — H3321 Serous retinal detachment, right eye: Secondary | ICD-10-CM | POA: Diagnosis not present

## 2020-05-02 DIAGNOSIS — H35033 Hypertensive retinopathy, bilateral: Secondary | ICD-10-CM

## 2020-05-02 DIAGNOSIS — I10 Essential (primary) hypertension: Secondary | ICD-10-CM

## 2020-05-02 DIAGNOSIS — H35371 Puckering of macula, right eye: Secondary | ICD-10-CM

## 2020-05-02 DIAGNOSIS — H25813 Combined forms of age-related cataract, bilateral: Secondary | ICD-10-CM

## 2020-05-12 ENCOUNTER — Other Ambulatory Visit: Payer: Self-pay

## 2020-05-12 ENCOUNTER — Ambulatory Visit
Admission: RE | Admit: 2020-05-12 | Discharge: 2020-05-12 | Disposition: A | Discharge status: Home / Self Care | Payer: Medicare PPO | Source: Ambulatory Visit | Attending: Physician Assistant | Admitting: Physician Assistant

## 2020-05-12 ENCOUNTER — Other Ambulatory Visit: Payer: Self-pay | Admitting: Physician Assistant

## 2020-05-12 DIAGNOSIS — M7989 Other specified soft tissue disorders: Secondary | ICD-10-CM | POA: Diagnosis not present

## 2020-06-12 NOTE — Progress Notes (Addendum)
[facility] Note  06/14/2020     CHIEF COMPLAINT Patient presents for Retina Follow Up   HISTORY OF PRESENT ILLNESS: Mariah Cortez is a 69 y.o. female who presents to the clinic today for:   HPI    Retina Follow Up    Patient presents with  Other.  In right eye.  This started months ago.  Severity is moderate.  Duration of 6 weeks.  Since onset it is gradually improving.  I, the attending physician,  performed the HPI with the patient and updated documentation appropriately.          Comments    69 y/o female pt here for 6 wk f/u.  Wasn't supposed to be seen back this soon, but was referred back by Dr. Talbert Forest to eval ERM OD s/p cat sx.  No change in [place] OU since last visit.  Denies pain, FOL, floaters, wavy lines, distortion.  AT prn OU.       Last edited by [name], MD on 06/14/2020  1:27 PM. (History)    pt states she is doing better, but not doing "all the way better", she states she has a "film" over her eye, she feels like there is something under her eyelid on the temporal side that feels "funny", pt states Dr. Talbert Forest saw a film on her eye that he wants to take off, but wants to make sure Dr. Coralyn Pear is okay with it first   Referring physician: Anell Barr, [place],  [place] [address]  HISTORICAL INFORMATION:   Selected notes from the MEDICAL RECORD NUMBER Referred by Dr. Orion Modest for concern of mac off RD   CURRENT MEDICATIONS: Current Outpatient Medications (Ophthalmic Drugs)  Medication Sig   bacitracin-polymyxin b (POLYSPORIN) ophthalmic ointment Place into the right eye at bedtime. Place a 1/2 inch ribbon of ointment into the lower eyelid. (Patient not taking: Reported on 06/14/2020)   brimonidine (ALPHAGAN) 0.2 % ophthalmic solution Place 1 drop into the right eye in the morning and at bedtime. (Patient not taking: Reported on 06/14/2020)   gatifloxacin (ZYMAXID) 0.5 % SOLN INSTILL ONE DROP INTO THE RIGHT EYE FOUR  TIMES DAILY (Patient not taking: Reported on 06/14/2020)   prednisoLONE acetate (PRED FORTE) 1 % ophthalmic suspension Place 1 drop into the right eye 4 (four) times daily. (Patient not taking: Reported on 06/14/2020)   Current Facility-Administered Medications (Ophthalmic Drugs)  Medication Route   gatifloxacin (ZYMAXID) 0.5 % ophthalmic drops 1 drop Right Eye   Current Outpatient Medications (Other)  Medication Sig   ACCU-CHEK GUIDE test strip    amLODipine (NORVASC) 5 MG tablet    APPLE CIDER VINEGAR PO Take 450 mg by mouth daily.   Ascorbic Acid (VITAMIN C CR) 1000 MG TBCR Take 1,000 mg by mouth daily.    aspirin 81 MG tablet Take 81 mg by mouth daily.     cetirizine (ZYRTEC) 10 MG tablet Take 10 mg by mouth daily.     Cholecalciferol (VITAMIN D3) 50 MCG (2000 UT) TABS Take 2,000 mg by mouth daily.   Cysteamine Bitartrate (PROCYSBI) 300 MG PACK by XX route as directed   esomeprazole (NEXIUM) 20 MG capsule Take 20 mg by mouth daily as needed (Heartburn).   Ferrous Sulfate (IRON) 325 (65 FE) MG TABS Take 325 mg by mouth in the morning and at bedtime.    furosemide (LASIX) 40 MG tablet Take by mouth.   glimepiride (AMARYL)  4 MG tablet Take 4 mg by mouth 2 (two) times daily.   hydrochlorothiazide (HYDRODIURIL) 25 MG tablet Take 25 mg by mouth daily.   HYDROcodone-acetaminophen (NORCO/VICODIN) 5-325 MG tablet Take 1 tablet by mouth every 4 (four) hours as needed for moderate pain.   losartan (COZAAR) 50 MG tablet Take 50 mg by mouth daily.   lovastatin (MEVACOR) 40 MG tablet Take 40 mg by mouth 2 (two) times daily.     meloxicam (MOBIC) 7.5 MG tablet Take 7.5 mg by mouth daily.   metFORMIN (GLUCOPHAGE) 1000 MG tablet Take 1,000 mg by mouth in the morning and at bedtime.    Misc Natural Products (OSTEO BI-FLEX JOINT SHIELD PO) Take 1 tablet by mouth in the morning and at bedtime.    Multiple Vitamin (MULTIVITAMIN) capsule Take 1 capsule by mouth daily.     pantoprazole  (PROTONIX) 40 MG tablet Take 40 mg by mouth daily.   pioglitazone (ACTOS) 30 MG tablet Take 30 mg by mouth daily.   Potassium 99 MG TABS Take 99 mg by mouth daily.   traMADol (ULTRAM) 50 MG tablet Take 50 mg by mouth 2 (two) times daily. Additional  50 mg if needed   vitamin B-12 (CYANOCOBALAMIN) 1000 MCG tablet Take 1,000 mcg by mouth daily.   valACYclovir (VALTREX) 1000 MG tablet Take 1 tablet (1,000 mg total) by mouth 3 (three) times daily. (Patient not taking: Reported on 06/14/2020)   No current facility-administered medications for this visit. (Other)   REVIEW OF SYSTEMS: ROS    Positive for: Gastrointestinal, Neurological, Endocrine, Eyes   Negative for: Constitutional, Skin, Genitourinary, Musculoskeletal, HENT, Cardiovascular, Respiratory, Psychiatric, Allergic/Imm, Heme/Lymph   Last edited by Matthew Folks, COA on 06/14/2020  9:29 AM. (History)     ALLERGIES Allergies  Allergen Reactions   Latex Rash    Latex tape   Morphine And Related Itching   PAST MEDICAL HISTORY Past Medical History:  Diagnosis Date   Allergies    Arthritis    Diabetes mellitus    GERD (gastroesophageal reflux disease)    Headache    Hyperlipidemia    Hypertension    Hypertensive retinopathy    OU   Retinal detachment    right eye   Stroke [facility])    " mild"   Wears glasses    Past Surgical History:  Procedure Laterality Date   CATARACT EXTRACTION Bilateral    Dr. Talbert Forest   COLONOSCOPY     EYE SURGERY Right 11/25/2019   SBP+PPV for RD repair - Dr. [name]   EYE SURGERY Bilateral    Cat Sx - Dr. Talbert Forest   GAS/FLUID EXCHANGE Right 11/25/2019   Procedure: Gas/Fluid Exchange;  Surgeon: [name], MD;  Location: [place];  Service: Ophthalmology;  Laterality: Right;   JOINT REPLACEMENT     B/L   LIPOMA RESECTION     Dr. [name]   PHOTOCOAGULATION Right 11/25/2019   Procedure: Photocoagulation;  Surgeon: [name], MD;  Location: [place];  Service:  Ophthalmology;  Laterality: Right;   REPLACEMENT TOTAL KNEE  2010   Right, Dr. [name]   REPLACEMENT TOTAL KNEE  2013   left   RETINAL DETACHMENT SURGERY Right 11/25/2019   SBP+PPV - Dr. [name]   VITRECTOMY [place] BUCKLE Right 11/25/2019   Procedure: VITRECTOMY 25 GAUGE WITH SCLERAL BUCKLE;  Surgeon: [name], MD;  Location: [place];  Service: Ophthalmology;  Laterality: Right;   FAMILY HISTORY Family History  Problem Relation Age  of Onset   Stroke Mother    Hypertension Mother    Diabetes Sister    Arthritis Maternal Aunt    SOCIAL HISTORY Social History   Tobacco Use   Smoking status: Never Smoker   Smokeless tobacco: Never Used  Scientific laboratory technician Use: Never used  Substance Use Topics   Alcohol use: Yes    Comment: occassionaly   Drug use: Never         OPHTHALMIC EXAM:  Base Eye Exam    Visual Acuity (Snellen - Linear)      Right Left   Dist cc 20/70 20/20   Dist ph cc 20/50        Tonometry (Tonopen, 9:33 AM)      Right Left   Pressure 12 13       Pupils      Dark Light Shape React APD   Right 3 2 Round Minimal None   Left 3 2 Round Minimal None       Visual Fields (Counting fingers)      Left Right    Full    Restrictions  Partial outer superior temporal deficiency       Extraocular Movement      Right Left    Full, Ortho Full, Ortho       Neuro/Psych    Oriented x3: Yes   Mood/Affect: Normal       Dilation    Both eyes: 1.0% Mydriacyl, 2.5% Phenylephrine @ 9:33 AM        Slit Lamp and Fundus Exam    Slit Lamp Exam      Right Left   Lids/Lashes Dermatochalasis - upper lid Dermatochalasis - upper lid   Conjunctiva/Sclera White and quiet, sutures dissolving White and quiet   Cornea No epi defect, 1+Punctate epithelial erosions, mild corneal haze, well healed temporal cataract wounds 1+Punctate epithelial erosions, well healed temporal cataract wounds   Anterior Chamber Deep and quiet Deep and  quiet   Iris Round and dilated, irregular, focal Posterior synechiae at 0400 Round and dilated, No NVI   Lens PC IOL in good position, 2+ fibrotic Posterior capsular opacification PC IOL in good position   Vitreous post vitrectomy, trace pigment Vitreous syneresis, Posterior vitreous detachment       Fundus Exam      Right Left   Disc Pink and Sharp Pink and Sharp   C/D Ratio 0.2 0.2   Macula Flat/reattached, good foveal reflex, RPE mottling, focal punctate PED superior to fovea, no heme, mild ERM Flat, Blunted foveal reflex, Retinal pigment epithelial mottling, No heme or edema   Vessels Vascular attenuation, Tortuous Vascular attenuation, mild Tortuousity   Periphery retina attached over buckle, good buckle height, good laser over buckle 360; mild ERM and fibrosis inferiorly ?improving ORIGINALLY: Inferior detachment from 0200-1030, Subretinal band IT midzone, ?small tear at 1030 Attached, 2 focal areas of pigment clumping temporally, No RT/RD on 360 exam          IMAGING AND PROCEDURES  Imaging and Procedures for _0 @  OCT, Retina - OU - Both Eyes       Right Eye Quality was good. Central Foveal Thickness: 283. Progression has improved. Findings include no SRF, outer retinal atrophy, epiretinal membrane, normal foveal contour, no IRF (Interval improvement in foveal profile and inferior ERM/edema).   Left Eye Quality was good. Central Foveal Thickness: 253. Progression has been stable. Findings include normal foveal contour, no IRF, no SRF.   Notes *  Images captured and stored on drive  Diagnosis / Impression:  OD: retina stably re-attached; +ERM--Interval improvement in foveal profile and inferior ERM/edema OS: NFP, no IRF/SRF  Clinical management:  See below  Abbreviations: NFP - Normal foveal profile. CME - cystoid macular edema. PED - pigment epithelial detachment. IRF - intraretinal fluid. SRF - subretinal fluid. EZ - ellipsoid zone. ERM - epiretinal membrane. ORA -  outer retinal atrophy. ORT - outer retinal tubulation. SRHM - subretinal hyper-reflective material                 ASSESSMENT/PLAN:    ICD-10-CM   1. Right retinal detachment  H33.21   2. Retinal edema  H35.81 OCT, Retina - OU - Both Eyes  3. Epiretinal membrane (ERM) of right eye  H35.371   4. Essential hypertension  I10   5. Hypertensive retinopathy of both eyes  H35.033   6. Pseudophakia of both eyes  Z96.1     1,2. 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 Epiretinal membrane, right eye   - very peripheral ERM -- non central  - no metamorphopsia - BCVA 20/50 OD  - OCT shows interval improvement in inferior ERM and retinal edema  - no indication for surgery at this time   - recommend monitoring for now  4,5. Hypertensive retinopathy OU - discussed importance of tight BP control - monitor  6. Pseudophakia OU  - s/p CE/IOL OU (Dr. Talbert Forest, OD: 07.19.21, OS: 08.09.21)  - IOLs in good position, doing well  - OD with fibrotic PCO  - clear from a retina standpoint to undergo YAG capsulotomy  - pt wishes to return for YAG  - f/u 2-3 wks   Ophthalmic Meds Ordered this visit:  No orders of the defined types were placed in this encounter.     Return for f/u 2-3 weeks, RD OD, DFE, OCT.  There are no Patient Instructions on file for this  visit.  Explained the diagnoses, plan, and follow up with the patient and they expressed understanding.  Patient expressed understanding of the importance of proper follow up care.    This document serves as a record of services personally performed by Gardiner Sleeper, MD, PhD. It was created on their behalf by Roselee Nova, COMT. The creation of this record is the provider's dictation and/or activities during the visit.  Electronically signed by: Roselee Nova, COMT 06/14/20 1:32 PM  This document serves as a record of services personally performed by Gardiner Sleeper, MD, PhD. It was created on their behalf by San Jetty. Owens Shark, OA an ophthalmic technician. The creation of this record is the provider's dictation and/or activities during the visit.    Electronically signed by: San Jetty. Owens Shark, [place] 10.06.2021 1:32 PM  Gardiner Sleeper, M.D., Ph.D. Diseases & Surgery of the Retina and Vitreous [facility]  I have reviewed the above documentation for accuracy and completeness, and I agree with  the above. Gardiner Sleeper, M.D., Ph.D. 06/14/20 1:32 PM   Abbreviations: M myopia (nearsighted); A astigmatism; H hyperopia (farsighted); P presbyopia; Mrx spectacle prescription;  CTL contact lenses; OD right eye; OS left eye; OU both eyes  XT exotropia; ET esotropia; PEK punctate epithelial keratitis; PEE punctate epithelial erosions; DES dry eye syndrome; MGD meibomian gland dysfunction; ATs artificial tears; PFAT's preservative free artificial tears; [place] nuclear sclerotic cataract; PSC posterior subcapsular cataract; ERM epi-retinal membrane; PVD posterior vitreous detachment; RD retinal detachment; DM diabetes mellitus; DR diabetic retinopathy; NPDR non-proliferative diabetic retinopathy; PDR proliferative diabetic retinopathy; CSME clinically significant macular edema; DME diabetic macular edema; dbh dot blot hemorrhages; CWS cotton wool spot; POAG primary open angle glaucoma; C/D  cup-to-disc ratio; HVF humphrey visual field; GVF goldmann visual field; OCT optical coherence tomography; IOP intraocular pressure; BRVO Branch retinal vein occlusion; CRVO central retinal vein occlusion; CRAO central retinal artery occlusion; BRAO branch retinal artery occlusion; RT retinal tear; SB scleral buckle; PPV pars plana vitrectomy; VH Vitreous hemorrhage; PRP panretinal laser photocoagulation; IVK intravitreal kenalog; VMT vitreomacular traction; MH Macular hole;  NVD neovascularization of the disc; NVE neovascularization elsewhere; AREDS age related eye disease study; ARMD age related macular degeneration; POAG primary open angle glaucoma; EBMD epithelial/anterior basement membrane dystrophy; ACIOL anterior chamber intraocular lens; IOL intraocular lens; PCIOL posterior chamber intraocular lens; Phaco/IOL phacoemulsification with intraocular lens placement; [place] photorefractive keratectomy; LASIK laser assisted in situ keratomileusis; HTN hypertension; DM diabetes mellitus; COPD chronic obstructive pulmonary disease

## 2020-06-14 ENCOUNTER — Other Ambulatory Visit: Payer: Self-pay

## 2020-06-14 ENCOUNTER — Ambulatory Visit (INDEPENDENT_AMBULATORY_CARE_PROVIDER_SITE_OTHER): Payer: Medicare PPO | Admitting: Ophthalmology

## 2020-06-14 ENCOUNTER — Encounter (INDEPENDENT_AMBULATORY_CARE_PROVIDER_SITE_OTHER): Payer: Self-pay | Admitting: Ophthalmology

## 2020-06-14 DIAGNOSIS — I10 Essential (primary) hypertension: Secondary | ICD-10-CM

## 2020-06-14 DIAGNOSIS — H35371 Puckering of macula, right eye: Secondary | ICD-10-CM

## 2020-06-14 DIAGNOSIS — H3581 Retinal edema: Secondary | ICD-10-CM

## 2020-06-14 DIAGNOSIS — Z961 Presence of intraocular lens: Secondary | ICD-10-CM

## 2020-06-14 DIAGNOSIS — H3321 Serous retinal detachment, right eye: Secondary | ICD-10-CM | POA: Diagnosis not present

## 2020-06-14 DIAGNOSIS — H35033 Hypertensive retinopathy, bilateral: Secondary | ICD-10-CM

## 2020-07-03 ENCOUNTER — Other Ambulatory Visit: Payer: Self-pay

## 2020-07-03 ENCOUNTER — Ambulatory Visit (INDEPENDENT_AMBULATORY_CARE_PROVIDER_SITE_OTHER): Payer: Medicare PPO | Admitting: Vascular Surgery

## 2020-07-03 ENCOUNTER — Encounter (INDEPENDENT_AMBULATORY_CARE_PROVIDER_SITE_OTHER): Payer: Self-pay | Admitting: Vascular Surgery

## 2020-07-03 VITALS — BP 177/80 | HR 91 | Resp 16 | Ht 59.0 in | Wt 267.0 lb

## 2020-07-03 DIAGNOSIS — I872 Venous insufficiency (chronic) (peripheral): Secondary | ICD-10-CM

## 2020-07-03 DIAGNOSIS — I10 Essential (primary) hypertension: Secondary | ICD-10-CM

## 2020-07-03 DIAGNOSIS — I739 Peripheral vascular disease, unspecified: Secondary | ICD-10-CM | POA: Insufficient documentation

## 2020-07-03 DIAGNOSIS — E782 Mixed hyperlipidemia: Secondary | ICD-10-CM

## 2020-07-03 DIAGNOSIS — I890 Lymphedema, not elsewhere classified: Secondary | ICD-10-CM

## 2020-07-03 DIAGNOSIS — Z8669 Personal history of other diseases of the nervous system and sense organs: Secondary | ICD-10-CM | POA: Insufficient documentation

## 2020-07-03 NOTE — Progress Notes (Signed)
MRN : [id number]  Mariah Cortez is a 69 y.o. (Jun 13, 1951) female who presents with chief complaint of No chief complaint on file. [name]  History of Present Illness:   Patient is seen for evaluation of leg pain and leg swelling. The patient first noticed the swelling remotely. The swelling is associated with pain and discoloration. The pain and swelling worsens with prolonged dependency and improves with elevation. The pain is unrelated to activity.  The patient notes that in the morning the legs are significantly improved but they steadily worsened throughout the course of the day. The patient also notes a steady worsening of the discoloration in the ankle and shin area.   She is also noting several "knots" in the medial left calf area they are tender to the touch.  The patient denies claudication symptoms.  The patient denies symptoms consistent with rest pain.  The patient has a history of DJD and LS spine disease.  The patient has no had any past angiography, interventions or vascular surgery.  Elevation makes the leg symptoms better, dependency makes them much worse. There is no history of ulcerations. The patient denies any recent changes in medications.  The patient has not been wearing graduated compression on a routine basis.  The patient denies a history of DVT or PE. There is no prior history of phlebitis. There is no history of primary lymphedema.  No history of malignancies. No history of trauma or groin or pelvic surgery. There is no history of radiation treatment to the groin or pelvis  The patient denies amaurosis fugax or recent TIA symptoms. There are no recent neurological changes noted. The patient denies recent episodes of angina or shortness of breath  No outpatient medications have been marked as taking for the 07/03/20 encounter (Appointment) with Gilda Crease, Latina Craver, MD.   Current Facility-Administered Medications for the 07/03/20 encounter (Appointment) with  Gilda Crease, Latina Craver, MD  Medication  . gatifloxacin (ZYMAXID) 0.5 % ophthalmic drops 1 drop    Past Medical History:  Diagnosis Date  . Allergies   . Arthritis   . Diabetes mellitus   . GERD (gastroesophageal reflux disease)   . Headache   . Hyperlipidemia   . Hypertension   . Hypertensive retinopathy    OU  . Retinal detachment    right eye  . Stroke [facility])    " mild"  . Wears glasses     Past Surgical History:  Procedure Laterality Date  . CATARACT EXTRACTION Bilateral    Dr. Vonna Kotyk  . COLONOSCOPY    . EYE SURGERY Right 11/25/2019   SBP+PPV for RD repair - Dr. Rennis Chris  . EYE SURGERY Bilateral    Cat Sx - Dr. Vonna Kotyk  . GAS/FLUID EXCHANGE Right 11/25/2019   Procedure: Gas/Fluid Exchange;  Surgeon: Rennis Chris, MD;  Location: [facility] OR;  Service: Ophthalmology;  Laterality: Right;  . JOINT REPLACEMENT     B/L  . LIPOMA RESECTION     Dr. Michela Pitcher  . PHOTOCOAGULATION Right 11/25/2019   Procedure: Photocoagulation;  Surgeon: Rennis Chris, MD;  Location: [facility] OR;  Service: Ophthalmology;  Laterality: Right;  . REPLACEMENT TOTAL KNEE  2010   Right, Dr. Ernest Pine  . REPLACEMENT TOTAL KNEE  2013   left  . RETINAL DETACHMENT SURGERY Right 11/25/2019   SBP+PPV - Dr. Rennis Chris  . VITRECTOMY 25 GAUGE WITH SCLERAL BUCKLE Right 11/25/2019   Procedure: VITRECTOMY 25 GAUGE WITH SCLERAL BUCKLE;  Surgeon: Rennis Chris, MD;  Location: [facility] OR;  Service: Ophthalmology;  Laterality: Right;    Social History Social History   Tobacco Use  . Smoking status: Never Smoker  . Smokeless tobacco: Never Used  Vaping Use  . Vaping Use: Never used  Substance Use Topics  . Alcohol use: Yes    Comment: occassionaly  . Drug use: Never    Family History Family History  Problem Relation Age of Onset  . Stroke Mother   . Hypertension Mother   . Diabetes Sister   . Arthritis Maternal Aunt   No family history of bleeding/clotting disorders, porphyria or autoimmune disease   Allergies   Allergen Reactions  . Latex Rash    Latex tape  . Morphine And Related Itching     REVIEW OF SYSTEMS (Negative unless checked)  Constitutional: [] Weight loss  [] Fever  [] Chills Cardiac: [] Chest pain   [] Chest pressure   [] Palpitations   [] Shortness of breath when laying flat   [] Shortness of breath with exertion. Vascular:  [] Pain in legs with walking   [x] Pain in legs at rest  [] History of DVT   [] Phlebitis   [x] Swelling in legs   [] Varicose veins   [] Non-healing ulcers Pulmonary:   [] Uses home oxygen   [] Productive cough   [] Hemoptysis   [] Wheeze  [] COPD   [] Asthma Neurologic:  [] Dizziness   [] Seizures   [] History of stroke   [] History of TIA  [] Aphasia   [] Vissual changes   [] Weakness or numbness in arm   [] Weakness or numbness in leg Musculoskeletal:   [] Joint swelling   [x] Joint pain   [x] Low back pain Hematologic:  [] Easy bruising  [] Easy bleeding   [] Hypercoagulable state   [] Anemic Gastrointestinal:  [] Diarrhea   [] Vomiting  [] Gastroesophageal reflux/heartburn   [] Difficulty swallowing. Genitourinary:  [] Chronic kidney disease   [] Difficult urination  [] Frequent urination   [] Blood in urine Skin:  [] Rashes   [] Ulcers  Psychological:  [] History of anxiety   []  History of major depression.  Physical Examination  There were no vitals filed for this visit. There is no height or weight on file to calculate BMI. Gen: WD/WN, NAD Head: [place]/AT, No temporalis wasting.  Ear/Nose/Throat: Hearing grossly intact, nares w/o erythema or drainage, poor dentition Eyes: PER, EOMI, sclera nonicteric.  Neck: Supple, no masses.  No bruit or JVD.  Pulmonary:  Good air movement, clear to auscultation bilaterally, no use of accessory muscles.  Cardiac: RRR, normal S1, S2, no Murmurs. Vascular: scattered varicosities present bilaterally.  Mild venous stasis changes to the legs bilaterally.  4+ hard non-pitting edema; there are palpable cords in the medial left calf these are tender to the  touch Vessel Right Left  Radial Palpable Palpable  PT  not palpable  not palpable  DP  not palpable  not palpable  Gastrointestinal: soft, non-distended. No guarding/no peritoneal signs.  Musculoskeletal: M/S 5/5 throughout.  No deformity or atrophy.  Neurologic: CN 2-12 intact. Pain and light touch intact in extremities.  Symmetrical.  Speech is fluent. Motor exam as listed above. Psychiatric: Judgment intact, Mood & affect appropriate for pt's clinical situation. Dermatologic: Venous rashes no ulcers noted.  No changes consistent with cellulitis. Lymph : + lichenification and skin changes of chronic lymphedema particularly in the ankles and the feet.  CBC Lab Results  Component Value Date   WBC 6.6 11/25/2019   HGB 12.2 11/25/2019   HCT 38.1 11/25/2019   MCV 96.5 11/25/2019   PLT 180 11/25/2019    BMET    Component Value Date/Time   NA  140 11/25/2019 1026   NA 141 01/08/2012 0402   K 4.1 11/25/2019 1026   K 3.7 01/08/2012 0402   CL 103 11/25/2019 1026   CL 103 01/08/2012 0402   CO2 26 11/25/2019 1026   CO2 31 01/08/2012 0402   GLUCOSE 155 (H) 11/25/2019 1026   GLUCOSE 165 (H) 01/08/2012 0402   BUN 22 11/25/2019 1026   BUN 7 01/08/2012 0402   CREATININE 0.92 11/25/2019 1026   CREATININE 0.57 (L) 01/08/2012 0402   CALCIUM 10.1 11/25/2019 1026   CALCIUM 9.1 01/08/2012 0402   GFRNONAA >60 11/25/2019 1026   GFRNONAA >60 01/08/2012 0402   GFRAA >60 11/25/2019 1026   GFRAA >60 01/08/2012 0402   CrCl cannot be calculated (Patient's most recent lab result is older than the maximum 21 days allowed.).  COAG Lab Results  Component Value Date   INR 0.9 12/24/2011    Radiology OCT, Retina - OU - Both Eyes  Result Date: 06/14/2020 Right Eye Quality was good. Central Foveal Thickness: 283. Progression has improved. Findings include no SRF, outer retinal atrophy, epiretinal membrane, normal foveal contour, no IRF (Interval improvement in foveal profile and inferior  ERM/edema). Left Eye Quality was good. Central Foveal Thickness: 253. Progression has been stable. Findings include normal foveal contour, no IRF, no SRF. Notes *Images captured and stored on drive Diagnosis / Impression: OD: retina stably re-attached; +ERM--Interval improvement in foveal profile and inferior ERM/edema OS: NFP, no IRF/SRF Clinical management: See below Abbreviations: NFP - Normal foveal profile. CME - cystoid macular edema. PED - pigment epithelial detachment. IRF - intraretinal fluid. SRF - subretinal fluid. EZ - ellipsoid zone. ERM - epiretinal membrane. ORA - outer retinal atrophy. ORT - outer retinal tubulation. SRHM - subretinal hyper-reflective material    Assessment/Plan 1. Chronic venous insufficiency I have had a long discussion with the patient regarding swelling and why it  causes symptoms.  Patient will begin wearing graduated compression stockings class 1 (20-30 mmHg) on a daily basis a prescription was given. The patient will  beginning wearing the stockings first thing in the morning and removing them in the evening. The patient is instructed specifically not to sleep in the stockings.   In addition, behavioral modification will be initiated.  This will include frequent elevation, use of over the counter pain medications and exercise such as walking.  I have reviewed systemic causes for chronic edema such as liver, kidney and cardiac etiologies.  The patient denies problems with these organ systems.    Consideration for a lymph pump will also be made based upon the effectiveness of conservative therapy.  This would help to improve the edema control and prevent sequela such as ulcers and infections   Patient should undergo duplex ultrasound of the venous system to ensure that DVT or reflux is not present.  The patient will follow-up with me after the ultrasound.   - VAS [place] LOWER EXTREMITY VENOUS REFLUX; Future  2. Lymphedema I have had a long discussion with the  patient regarding swelling and why it  causes symptoms.  Patient will begin wearing graduated compression stockings class 1 (20-30 mmHg) on a daily basis a prescription was given. The patient will  beginning wearing the stockings first thing in the morning and removing them in the evening. The patient is instructed specifically not to sleep in the stockings.   In addition, behavioral modification will be initiated.  This will include frequent elevation, use of over the counter pain medications and exercise such  as walking.  I have reviewed systemic causes for chronic edema such as liver, kidney and cardiac etiologies.  The patient denies problems with these organ systems.    Consideration for a lymph pump will also be made based upon the effectiveness of conservative therapy.  This would help to improve the edema control and prevent sequela such as ulcers and infections   Patient should undergo duplex ultrasound of the venous system to ensure that DVT or reflux is not present.  The patient will follow-up with me after the ultrasound.   - VAS US LOWER EXTREMITY VENOUS REFLUX; Future  3. PAD (peripheral artery disease) (HCC) Given the lack of palpable pulses in association with her lower extremity symptoms.  I will order ABIs. - VAS US ABI WITH/WO TBI; Future  4. Mixed hyperlipidemia Continue statin as ordered and reviewed, no changes at this time   5. Primary hypertension Continue antihypertensive medications as already ordered, these medications have been reviewed and there are no changes at this time.     Levora DredgeGregory Nikoli Nasser, MD  07/03/2020 12:50 PM

## 2020-07-04 NOTE — Progress Notes (Signed)
[facility] Note  07/05/2020     CHIEF COMPLAINT Patient presents for Retina Follow Up   HISTORY OF PRESENT ILLNESS: Mariah Cortez is a 69 y.o. female who presents to the clinic today for:   HPI    Retina Follow Up    Patient presents with  Retinal Break/Detachment.  In right eye.  This started 3 weeks ago.  I, the attending physician,  performed the HPI with the patient and updated documentation appropriately.          Comments    Patient here for 3 weeks retina follow up for RD OD. Patient states vision about the same. Not quite as bad. Here to take film off today. Legs have edema.        Last edited by [name], MD on 07/05/2020 11:25 AM. (History)    pt here for YAG OD   Referring physician: [name], MD [address] [place],  [place] [address]  HISTORICAL INFORMATION:   Selected notes from the MEDICAL RECORD NUMBER Referred by Dr. Orion Modest for concern of mac off RD   CURRENT MEDICATIONS: Current Outpatient Medications (Ophthalmic Drugs)  Medication Sig  . bacitracin-polymyxin b (POLYSPORIN) ophthalmic ointment Place into the right eye at bedtime. Place a 1/2 inch ribbon of ointment into the lower eyelid. (Patient not taking: Reported on 06/14/2020)  . brimonidine (ALPHAGAN) 0.2 % ophthalmic solution Place 1 drop into the right eye in the morning and at bedtime. (Patient not taking: Reported on 06/14/2020)  . gatifloxacin (ZYMAXID) 0.5 % SOLN INSTILL ONE DROP INTO THE RIGHT EYE FOUR TIMES DAILY (Patient not taking: Reported on 06/14/2020)  . prednisoLONE acetate (PRED FORTE) 1 % ophthalmic suspension Place 1 drop into the right eye 4 (four) times daily for 7 days.   Current Facility-Administered Medications (Ophthalmic Drugs)  Medication Route  . gatifloxacin (ZYMAXID) 0.5 % ophthalmic drops 1 drop Right Eye   Current Outpatient Medications (Other)  Medication Sig  . ACCU-CHEK GUIDE test  strip   . amLODipine (NORVASC) 5 MG tablet   . APPLE CIDER VINEGAR PO Take 450 mg by mouth daily.  . Ascorbic Acid (VITAMIN C CR) 1000 MG TBCR Take 1,000 mg by mouth daily.   [name] aspirin 81 MG tablet Take 81 mg by mouth daily.    . cetirizine (ZYRTEC) 10 MG tablet Take 10 mg by mouth daily.    . Cholecalciferol (VITAMIN D3) 50 MCG (2000 UT) TABS Take 2,000 mg by mouth daily.  . Cysteamine Bitartrate (PROCYSBI) 300 MG PACK by XX route as directed  . esomeprazole (NEXIUM) 20 MG capsule Take 20 mg by mouth daily as needed (Heartburn).  . Ferrous Sulfate (IRON) 325 (65 FE) MG TABS Take 325 mg by mouth in the morning and at bedtime.   . furosemide (LASIX) 40 MG tablet Take by mouth.  [name] glimepiride (AMARYL) 4 MG tablet Take 4 mg by mouth 2 (two) times daily.  . hydrochlorothiazide (HYDRODIURIL) 25 MG tablet Take 25 mg by mouth daily.  [name] HYDROcodone-acetaminophen (NORCO/VICODIN) 5-325 MG tablet Take 1 tablet by mouth every 4 (four) hours as needed for moderate pain. (Patient not taking: Reported on 07/03/2020)  . losartan (COZAAR) 50 MG tablet Take 50 mg by mouth daily.  [name] lovastatin (MEVACOR) 40 MG tablet Take 40 mg by mouth 2 (two) times daily.    . meloxicam (MOBIC) 7.5 MG tablet Take 7.5 mg by mouth daily.  [name]  metFORMIN (GLUCOPHAGE) 1000 MG tablet Take 1,000 mg by mouth in the morning and at bedtime.   . Misc Natural Products (OSTEO BI-FLEX JOINT SHIELD PO) Take 1 tablet by mouth in the morning and at bedtime.   . Multiple Vitamin (MULTIVITAMIN) capsule Take 1 capsule by mouth daily.    . pantoprazole (PROTONIX) 40 MG tablet Take 40 mg by mouth daily.  . pioglitazone (ACTOS) 30 MG tablet Take 30 mg by mouth daily.  . Potassium 99 MG TABS Take 99 mg by mouth daily.  . traMADol (ULTRAM) 50 MG tablet Take 50 mg by mouth 2 (two) times daily. Additional  50 mg if needed  . valACYclovir (VALTREX) 1000 MG tablet Take 1 tablet (1,000 mg total) by mouth 3 (three) times daily. (Patient not taking: Reported  on 06/14/2020)  . vitamin B-12 (CYANOCOBALAMIN) 1000 MCG tablet Take 1,000 mcg by mouth daily.   No current facility-administered medications for this visit. (Other)   REVIEW OF SYSTEMS: ROS    Positive for: Gastrointestinal, Neurological, Endocrine, Eyes   Negative for: Constitutional, Skin, Genitourinary, Musculoskeletal, HENT, Cardiovascular, Respiratory, Psychiatric, Allergic/Imm, Heme/Lymph   Last edited by Theodore Demark, COA on 07/05/2020 10:12 AM. (History)     ALLERGIES Allergies  Allergen Reactions  . Latex Rash    Latex tape  . Morphine And Related Itching   PAST MEDICAL HISTORY Past Medical History:  Diagnosis Date  . Allergies   . Arthritis   . Diabetes mellitus   . GERD (gastroesophageal reflux disease)   . Headache   . Hyperlipidemia   . Hypertension   . Hypertensive retinopathy    OU  . Retinal detachment    right eye  . Stroke [facility])    " mild"  . Wears glasses    Past Surgical History:  Procedure Laterality Date  . CATARACT EXTRACTION Bilateral    Dr. Talbert Forest  . COLONOSCOPY    . EYE SURGERY Right 11/25/2019   SBP+PPV for RD repair - Dr. [name]  . EYE SURGERY Bilateral    Cat Sx - Dr. Talbert Forest  . GAS/FLUID EXCHANGE Right 11/25/2019   Procedure: Gas/Fluid Exchange;  Surgeon: [name], MD;  Location: [place];  Service: Ophthalmology;  Laterality: Right;  . JOINT REPLACEMENT     B/L  . LIPOMA RESECTION     Dr. [name]  . PHOTOCOAGULATION Right 11/25/2019   Procedure: Photocoagulation;  Surgeon: [name], MD;  Location: [place];  Service: Ophthalmology;  Laterality: Right;  . REPLACEMENT TOTAL KNEE  2010   Right, Dr. [name]  . REPLACEMENT TOTAL KNEE  2013   left  . RETINAL DETACHMENT SURGERY Right 11/25/2019   SBP+PPV - Dr. [name]  . VITRECTOMY 25 GAUGE WITH SCLERAL BUCKLE Right 11/25/2019   Procedure: VITRECTOMY 25 GAUGE WITH SCLERAL BUCKLE;  Surgeon: [name], MD;  Location: [place];  Service: Ophthalmology;  Laterality: Right;    FAMILY HISTORY Family History  Problem Relation Age of Onset  . Stroke Mother   . Hypertension Mother   . Diabetes Sister   . Arthritis Maternal Aunt    SOCIAL HISTORY Social History   Tobacco Use  . Smoking status: Never Smoker  . Smokeless tobacco: Never Used  Vaping Use  . Vaping Use: Never used  Substance Use Topics  . Alcohol use: Yes    Comment: occassionaly  . Drug use: Never         OPHTHALMIC EXAM:  Base Eye Exam    Visual Acuity (Snellen -  Linear)      Right Left   Dist cc 20/80 -2 20/20   Dist ph cc 20/30 -1    Correction: Glasses       Tonometry (Tonopen, 10:08 AM)      Right Left   Pressure 17 22       Pupils      Dark Light Shape React APD   Right 3 2 Round Minimal None   Left 3 2 Round Minimal None       Visual Fields      Left Right    Full    Restrictions  Partial outer superior temporal deficiency       Extraocular Movement      Right Left    Full, Ortho Full, Ortho       Neuro/Psych    Oriented x3: Yes   Mood/Affect: Normal       Dilation    Both eyes: 1.0% Mydriacyl, 2.5% Phenylephrine @ 10:08 AM        Slit Lamp and Fundus Exam    Slit Lamp Exam      Right Left   Lids/Lashes Dermatochalasis - upper lid Dermatochalasis - upper lid   Conjunctiva/Sclera White and quiet, sutures dissolving White and quiet   Cornea No epi defect, 1+Punctate epithelial erosions, mild corneal haze, well healed temporal cataract wounds 1+Punctate epithelial erosions, well healed temporal cataract wounds   Anterior Chamber Deep and quiet Deep and quiet   Iris Round and dilated, irregular, focal Posterior synechiae at 0400 Round and dilated, No NVI   Lens PC IOL in good position, 2+ fibrotic Posterior capsular opacification PC IOL in good position   Vitreous post vitrectomy, trace pigment Vitreous syneresis, Posterior vitreous detachment       Fundus Exam      Right Left   Disc Pink and Sharp Pink and Sharp   C/D Ratio 0.2 0.2   Macula  Flat/reattached, good foveal reflex, RPE mottling, focal punctate PED superior to fovea, no heme, mild ERM Flat, Blunted foveal reflex, Retinal pigment epithelial mottling, No heme or edema   Vessels Vascular attenuation, Tortuous Vascular attenuation, mild Tortuousity   Periphery retina attached over buckle, good buckle height, good laser over buckle 360; mild ERM and fibrosis inferiorly ?improving ORIGINALLY: Inferior detachment from 0200-1030, Subretinal band IT midzone, ?small tear at 1030 Attached, 2 focal areas of pigment clumping temporally, No RT/RD on 360 exam        Refraction    Wearing Rx      Sphere Cylinder Axis Add   Right -0.50 +1.75 161 +2.25   Left -1.50 +1.00 002 +2.25          IMAGING AND PROCEDURES  Imaging and Procedures for _0 @  OCT, Retina - OU - Both Eyes       Right Eye Quality was borderline. Central Foveal Thickness: 282. Progression has improved. Findings include no SRF, outer retinal atrophy, epiretinal membrane, normal foveal contour, no IRF (Stable improvement in foveal profile and interval improvement in inferior ERM/edema).   Left Eye Quality was good. Central Foveal Thickness: 251. Progression has been stable. Findings include normal foveal contour, no IRF, no SRF.   Notes *Images captured and stored on drive  Diagnosis / Impression:  OD: retina stably re-attached; +ERM -- Stable improvement in foveal profile and interval improvement in inferior ERM/edema OS: NFP, no IRF/SRF  Clinical management:  See below  Abbreviations: NFP - Normal foveal profile. CME - cystoid macular edema. PED -  pigment epithelial detachment. IRF - intraretinal fluid. SRF - subretinal fluid. EZ - ellipsoid zone. ERM - epiretinal membrane. ORA - outer retinal atrophy. ORT - outer retinal tubulation. SRHM - subretinal hyper-reflective material        Yag Capsulotomy - OD - Right Eye       Procedure note: YAG Capsulotomy, Right Eye  Informed consent  obtained. Pre-op dilating drops (1% Topicamide and 2.5% Phenylephrine), and topical anesthesia given. Power: 6.6 mJ Shots: 35 Posterior capsulotomy in cruciate formation performed without difficulty. Patient tolerated procedure well. No complications. Rx pred forte 4 times a day for 7 days, then stop. Pt received written and verbal post laser education. Recheck in 1-2 weeks w/ dilated exam.                 ASSESSMENT/PLAN:    ICD-10-CM   1. Right retinal detachment  H33.21   2. Retinal edema  H35.81 OCT, Retina - OU - Both Eyes  3. Epiretinal membrane (ERM) of right eye  H35.371   4. Essential hypertension  I10   5. Hypertensive retinopathy of both eyes  H35.033   6. Pseudophakia of both eyes  Z96.1 Yag Capsulotomy - OD - Right Eye  7. Right posterior capsular opacification  H26.491 Yag Capsulotomy - OD - Right Eye    1,2. 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 Epiretinal membrane, right eye   - very peripheral ERM -- non central  - no metamorphopsia - BCVA 20/30 OD  - OCT shows interval improvement in inferior ERM and retinal edema  - no indication for surgery at this time   - recommend monitoring for now  4,5. Hypertensive retinopathy OU - discussed importance of tight BP control - monitor  6. Pseudophakia OU w/ PCO OD  - s/p CE/IOL OU (Dr. Talbert Forest, OD:  07.19.21, OS: 08.09.21)  - IOLs in good position, doing well  - OD with fibrotic PCO  - recommend YAG cap OD today, 10.27.21  - pt wishes to proceed  - RBA of procedure discussed, questions answered  - informed consent obtained and signed  - see procedure note  - f/u November 9, DFE, OCT   Ophthalmic Meds Ordered this visit:  Meds ordered this encounter  Medications  . prednisoLONE acetate (PRED FORTE) 1 % ophthalmic suspension    Sig: Place 1 drop into the right eye 4 (four) times daily for 7 days.    Dispense:  10 mL    Refill:  0      Return in about 13 days (around 07/18/2020) for f/u November 9, RD OD, DFE, OCT.  There are no Patient Instructions on file for this visit.  Explained the diagnoses, plan, and follow up with the patient and they expressed understanding.  Patient expressed understanding of the importance of proper follow up care.    This document serves as a record of services personally performed by Gardiner Sleeper, MD, PhD. It was created on their  behalf by Roselee Nova, COMT. The creation of this record is the provider's dictation and/or activities during the visit.  Electronically signed by: Roselee Nova, COMT 07/05/20 12:09 PM   Gardiner Sleeper, M.D., Ph.D. Diseases & Surgery of the Retina and Vitreous [facility]  I have reviewed the above documentation for accuracy and completeness, and I agree with the above. Gardiner Sleeper, M.D., Ph.D. 07/05/20 12:09 PM   Abbreviations: M myopia (nearsighted); A astigmatism; H hyperopia (farsighted); P presbyopia; Mrx spectacle prescription;  CTL contact lenses; OD right eye; OS left eye; OU both eyes  XT exotropia; ET esotropia; PEK punctate epithelial keratitis; PEE punctate epithelial erosions; DES dry eye syndrome; MGD meibomian gland dysfunction; ATs artificial tears; PFAT's preservative free artificial tears; [place] nuclear sclerotic cataract; PSC posterior subcapsular cataract; ERM  epi-retinal membrane; PVD posterior vitreous detachment; RD retinal detachment; DM diabetes mellitus; DR diabetic retinopathy; NPDR non-proliferative diabetic retinopathy; PDR proliferative diabetic retinopathy; CSME clinically significant macular edema; DME diabetic macular edema; dbh dot blot hemorrhages; CWS cotton wool spot; POAG primary open angle glaucoma; C/D cup-to-disc ratio; HVF humphrey visual field; GVF goldmann visual field; OCT optical coherence tomography; IOP intraocular pressure; BRVO Branch retinal vein occlusion; CRVO central retinal vein occlusion; CRAO central retinal artery occlusion; BRAO branch retinal artery occlusion; RT retinal tear; SB scleral buckle; PPV pars plana vitrectomy; VH Vitreous hemorrhage; PRP panretinal laser photocoagulation; IVK intravitreal kenalog; VMT vitreomacular traction; MH Macular hole;  NVD neovascularization of the disc; NVE neovascularization elsewhere; AREDS age related eye disease study; ARMD age related macular degeneration; POAG primary open angle glaucoma; EBMD epithelial/anterior basement membrane dystrophy; ACIOL anterior chamber intraocular lens; IOL intraocular lens; PCIOL posterior chamber intraocular lens; Phaco/IOL phacoemulsification with intraocular lens placement; [place] photorefractive keratectomy; LASIK laser assisted in situ keratomileusis; HTN hypertension; DM diabetes mellitus; COPD chronic obstructive pulmonary disease

## 2020-07-05 ENCOUNTER — Encounter (INDEPENDENT_AMBULATORY_CARE_PROVIDER_SITE_OTHER): Payer: Self-pay | Admitting: Ophthalmology

## 2020-07-05 ENCOUNTER — Other Ambulatory Visit: Payer: Self-pay

## 2020-07-05 ENCOUNTER — Ambulatory Visit (INDEPENDENT_AMBULATORY_CARE_PROVIDER_SITE_OTHER): Payer: Medicare PPO | Admitting: Ophthalmology

## 2020-07-05 DIAGNOSIS — I10 Essential (primary) hypertension: Secondary | ICD-10-CM | POA: Diagnosis not present

## 2020-07-05 DIAGNOSIS — H35371 Puckering of macula, right eye: Secondary | ICD-10-CM

## 2020-07-05 DIAGNOSIS — Z961 Presence of intraocular lens: Secondary | ICD-10-CM

## 2020-07-05 DIAGNOSIS — H26491 Other secondary cataract, right eye: Secondary | ICD-10-CM | POA: Diagnosis not present

## 2020-07-05 DIAGNOSIS — H3581 Retinal edema: Secondary | ICD-10-CM | POA: Diagnosis not present

## 2020-07-05 DIAGNOSIS — H3321 Serous retinal detachment, right eye: Secondary | ICD-10-CM

## 2020-07-05 DIAGNOSIS — H35033 Hypertensive retinopathy, bilateral: Secondary | ICD-10-CM

## 2020-07-05 MED ORDER — PREDNISOLONE ACETATE 1 % OP SUSP: 1.0000 [drp] | Freq: Four times a day (QID) | OPHTHALMIC | 0 refills | Status: AC

## 2020-07-12 ENCOUNTER — Ambulatory Visit (INDEPENDENT_AMBULATORY_CARE_PROVIDER_SITE_OTHER): Payer: Medicare PPO

## 2020-07-12 ENCOUNTER — Other Ambulatory Visit: Payer: Self-pay

## 2020-07-12 ENCOUNTER — Ambulatory Visit (INDEPENDENT_AMBULATORY_CARE_PROVIDER_SITE_OTHER): Payer: Medicare PPO | Admitting: Nurse Practitioner

## 2020-07-12 ENCOUNTER — Encounter (INDEPENDENT_AMBULATORY_CARE_PROVIDER_SITE_OTHER): Payer: Self-pay | Admitting: Nurse Practitioner

## 2020-07-12 VITALS — BP 159/90 | HR 97 | Ht 59.0 in | Wt 269.0 lb

## 2020-07-12 DIAGNOSIS — M79604 Pain in right leg: Secondary | ICD-10-CM

## 2020-07-12 DIAGNOSIS — I872 Venous insufficiency (chronic) (peripheral): Secondary | ICD-10-CM

## 2020-07-12 DIAGNOSIS — I739 Peripheral vascular disease, unspecified: Secondary | ICD-10-CM | POA: Diagnosis not present

## 2020-07-12 DIAGNOSIS — I890 Lymphedema, not elsewhere classified: Secondary | ICD-10-CM

## 2020-07-12 DIAGNOSIS — E782 Mixed hyperlipidemia: Secondary | ICD-10-CM

## 2020-07-12 DIAGNOSIS — M79605 Pain in left leg: Secondary | ICD-10-CM

## 2020-07-12 DIAGNOSIS — I10 Essential (primary) hypertension: Secondary | ICD-10-CM

## 2020-07-12 DIAGNOSIS — E669 Obesity, unspecified: Secondary | ICD-10-CM | POA: Insufficient documentation

## 2020-07-14 NOTE — Progress Notes (Signed)
[facility] Note  07/18/2020     CHIEF COMPLAINT Patient presents for Retina Follow Up   HISTORY OF PRESENT ILLNESS: Mariah Cortez is a 69 y.o. female who presents to the clinic today for:   HPI    Retina Follow Up    Patient presents with  Other.  In right eye.  This started weeks ago.  Severity is moderate.  Duration of weeks.  Since onset it is gradually worsening.  I, the attending physician,  performed the HPI with the patient and updated documentation appropriately.          Comments    Pt states vision is the same OD.  Patient complains of film over vision OD that is not going away.  Pt denies eye pain or discomfort.  Patient denies any new or worsening floaters or fol OU.       Last edited by [name], MD on 07/23/2020  1:48 AM. (History)    Patient states has film over vision that is not going away OD.    Referring physician: [name], MD [facility] [place],  [place] [address]  HISTORICAL INFORMATION:   Selected notes from the MEDICAL RECORD NUMBER Referred by Dr. Orion Modest for concern of mac off RD   CURRENT MEDICATIONS: Current Outpatient Medications (Ophthalmic Drugs)  Medication Sig  . bacitracin-polymyxin b (POLYSPORIN) ophthalmic ointment Place into the right eye at bedtime. Place a 1/2 inch ribbon of ointment into the lower eyelid.  [name] brimonidine (ALPHAGAN) 0.2 % ophthalmic solution Place 1 drop into the right eye in the morning and at bedtime.  [name] gatifloxacin (ZYMAXID) 0.5 % SOLN INSTILL ONE DROP INTO THE RIGHT EYE FOUR TIMES DAILY   Current Facility-Administered Medications (Ophthalmic Drugs)  Medication Route  . gatifloxacin (ZYMAXID) 0.5 % ophthalmic drops 1 drop Right Eye   Current Outpatient Medications (Other)  Medication Sig  . ACCU-CHEK GUIDE test strip   . amLODipine (NORVASC) 5 MG tablet   . APPLE CIDER VINEGAR PO Take 450 mg by mouth daily.  . Ascorbic Acid  (VITAMIN C CR) 1000 MG TBCR Take 1,000 mg by mouth daily.   [name] aspirin 81 MG tablet Take 81 mg by mouth daily.    . cetirizine (ZYRTEC) 10 MG tablet Take 10 mg by mouth daily.    . Cholecalciferol (VITAMIN D3) 50 MCG (2000 UT) TABS Take 2,000 mg by mouth daily.  . Cysteamine Bitartrate (PROCYSBI) 300 MG PACK by XX route as directed  . esomeprazole (NEXIUM) 20 MG capsule Take 20 mg by mouth daily as needed (Heartburn).  . Ferrous Sulfate (IRON) 325 (65 FE) MG TABS Take 325 mg by mouth in the morning and at bedtime.   . furosemide (LASIX) 40 MG tablet Take by mouth.  [name] glimepiride (AMARYL) 4 MG tablet Take 4 mg by mouth 2 (two) times daily.  . hydrochlorothiazide (HYDRODIURIL) 25 MG tablet Take 25 mg by mouth daily.  [name] HYDROcodone-acetaminophen (NORCO/VICODIN) 5-325 MG tablet Take 1 tablet by mouth every 4 (four) hours as needed for moderate pain.  [name] losartan (COZAAR) 50 MG tablet Take 50 mg by mouth daily.  [name] lovastatin (MEVACOR) 40 MG tablet Take 40 mg by mouth 2 (two) times daily.    . meloxicam (MOBIC) 7.5 MG tablet Take 7.5 mg by mouth daily.  . metFORMIN (GLUCOPHAGE) 1000 MG tablet Take 1,000 mg by mouth in the morning and at bedtime.   [name]  Misc Natural Products (OSTEO BI-FLEX JOINT SHIELD PO) Take 1 tablet by mouth in the morning and at bedtime.   . Multiple Vitamin (MULTIVITAMIN) capsule Take 1 capsule by mouth daily.    . pantoprazole (PROTONIX) 40 MG tablet Take 40 mg by mouth daily.  . pioglitazone (ACTOS) 30 MG tablet Take 30 mg by mouth daily.  . Potassium 99 MG TABS Take 99 mg by mouth daily.  . traMADol (ULTRAM) 50 MG tablet Take 50 mg by mouth 2 (two) times daily. Additional  50 mg if needed  . valACYclovir (VALTREX) 1000 MG tablet Take 1 tablet (1,000 mg total) by mouth 3 (three) times daily.  . vitamin B-12 (CYANOCOBALAMIN) 1000 MCG tablet Take 1,000 mcg by mouth daily.   No current facility-administered medications for this visit. (Other)   REVIEW OF SYSTEMS: ROS    Positive  for: Gastrointestinal, Neurological, Endocrine, Eyes   Negative for: Constitutional, Skin, Genitourinary, Musculoskeletal, HENT, Cardiovascular, Respiratory, Psychiatric, Allergic/Imm, Heme/Lymph   Last edited by [name] on 07/18/2020  2:27 PM. (History)     ALLERGIES Allergies  Allergen Reactions  . Latex Rash    Latex tape  . Morphine And Related Itching   PAST MEDICAL HISTORY Past Medical History:  Diagnosis Date  . Allergies   . Arthritis   . Diabetes mellitus   . GERD (gastroesophageal reflux disease)   . Headache   . Hyperlipidemia   . Hypertension   . Hypertensive retinopathy    OU  . Retinal detachment    right eye  . Stroke [facility])    " mild"  . Wears glasses    Past Surgical History:  Procedure Laterality Date  . CATARACT EXTRACTION Bilateral    Dr. Talbert Forest  . COLONOSCOPY    . EYE SURGERY Right 11/25/2019   SBP+PPV for RD repair - Dr. [name]  . EYE SURGERY Bilateral    Cat Sx - Dr. Talbert Forest  . GAS/FLUID EXCHANGE Right 11/25/2019   Procedure: Gas/Fluid Exchange;  Surgeon: [name], MD;  Location: [facility];  Service: Ophthalmology;  Laterality: Right;  . JOINT REPLACEMENT     B/L  . LIPOMA RESECTION     Dr. [name]  . PHOTOCOAGULATION Right 11/25/2019   Procedure: Photocoagulation;  Surgeon: [name], MD;  Location: [facility];  Service: Ophthalmology;  Laterality: Right;  . REPLACEMENT TOTAL KNEE  2010   Right, Dr. [name]  . REPLACEMENT TOTAL KNEE  2013   left  . RETINAL DETACHMENT SURGERY Right 11/25/2019   SBP+PPV - Dr. [name]  . VITRECTOMY 25 GAUGE WITH SCLERAL BUCKLE Right 11/25/2019   Procedure: VITRECTOMY 25 GAUGE WITH SCLERAL BUCKLE;  Surgeon: [name], MD;  Location: [facility];  Service: Ophthalmology;  Laterality: Right;   FAMILY HISTORY Family History  Problem Relation Age of Onset  . Stroke Mother   . Hypertension Mother   . Diabetes Sister   . Arthritis Maternal Aunt    SOCIAL HISTORY Social History   Tobacco Use  .  Smoking status: Never Smoker  . Smokeless tobacco: Never Used  Vaping Use  . Vaping Use: Never used  Substance Use Topics  . Alcohol use: Yes    Comment: occassionaly  . Drug use: Never         OPHTHALMIC EXAM:  Base Eye Exam    Visual Acuity (Snellen - Linear)      Right Left   Dist [place] 20/150 -1 20/20 -1   Dist ph [place] 20/40 -1  Tonometry (Tonopen, 2:35 PM)      Right Left   Pressure 15 14       Pupils      Dark Light Shape React APD   Right 2 1 Round Minimal 0   Left 2 1 Round Minimal 0       Visual Fields      Left Right    Full Full       Extraocular Movement      Right Left    Full Full       Neuro/Psych    Oriented x3: Yes   Mood/Affect: Normal       Dilation    Both eyes: 1.0% Mydriacyl, 2.5% Phenylephrine @ 2:35 PM        Slit Lamp and Fundus Exam    Slit Lamp Exam      Right Left   Lids/Lashes Dermatochalasis - upper lid Dermatochalasis - upper lid   Conjunctiva/Sclera White and quiet, sutures dissolving White and quiet   Cornea No epi defect, 1+Punctate epithelial erosions, mild corneal haze, well healed temporal cataract wounds 1+Punctate epithelial erosions, well healed temporal cataract wounds   Anterior Chamber Deep and quiet Deep and quiet   Iris Round and dilated, irregular, focal Posterior synechiae at 0400 Round and dilated, No NVI   Lens PC IOL in good position, open PC PC IOL in good position   Vitreous post vitrectomy, trace pigment, mild vitreous debris Vitreous syneresis, Posterior vitreous detachment       Fundus Exam      Right Left   Disc Pink and Sharp Pink and Sharp   C/D Ratio 0.2 0.2   Macula Flat/reattached, good foveal reflex, RPE mottling, focal punctate PED superior to fovea, no heme, mild ERM Flat, Blunted foveal reflex, Retinal pigment epithelial mottling, No heme or edema   Vessels Vascular attenuation, Tortuous Vascular attenuation, mild Tortuousity   Periphery retina attached over buckle, good buckle  height, good laser over buckle 360; mild ERM and fibrosis inferiorly--improving ORIGINALLY: Inferior detachment from 0200-1030, Subretinal band IT midzone, ?small tear at 1030 Attached, 2 focal areas of pigment clumping temporally, No RT/RD on 360 exam          IMAGING AND PROCEDURES  Imaging and Procedures for _0 @  OCT, Retina - OU - Both Eyes       Right Eye Quality was good. Central Foveal Thickness: 275. Progression has improved. Findings include no SRF, outer retinal atrophy, epiretinal membrane, normal foveal contour, no IRF (Stable improvement in foveal profile and interval improvement in inferior ERM/edema).   Left Eye Quality was good. Central Foveal Thickness: 251. Progression has been stable. Findings include normal foveal contour, no IRF, no SRF.   Notes *Images captured and stored on drive  Diagnosis / Impression:  OD: retina stably re-attached; +ERM -- Stable improvement in foveal profile and interval improvement in inferior ERM/edema OS: NFP, no IRF/SRF  Clinical management:  See below  Abbreviations: NFP - Normal foveal profile. CME - cystoid macular edema. PED - pigment epithelial detachment. IRF - intraretinal fluid. SRF - subretinal fluid. EZ - ellipsoid zone. ERM - epiretinal membrane. ORA - outer retinal atrophy. ORT - outer retinal tubulation. SRHM - subretinal hyper-reflective material                 ASSESSMENT/PLAN:    ICD-10-CM   1. Right retinal detachment  H33.21   2. Retinal edema  H35.81 OCT, Retina - OU - Both Eyes  3. Epiretinal  membrane (ERM) of right eye  H35.371   4. Essential hypertension  I10   5. Hypertensive retinopathy of both eyes  H35.033   6. Pseudophakia of both eyes  Z96.1     1,2. 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 Epiretinal membrane, right eye   - very peripheral ERM -- non central  - no metamorphopsia - BCVA 20/30 OD  - OCT shows interval improvement in inferior ERM and retinal edema  - no indication for surgery at this time   - recommend monitoring for now  4,5. Hypertensive retinopathy OU - discussed importance of tight BP control - monitor  6. Pseudophakia OU w/ PCO OD  - s/p CE/IOL OU (Dr. Talbert Forest, OD: 07.19.21, OS: 08.09.21)  - IOLs in good position, doing well  - s/p YAG cap OD 10.27.21 -- PC nicely open  - f/u 4 months   Ophthalmic Meds Ordered this visit:  No orders of the defined types were placed in this encounter.     Return in about 4 months (around 11/15/2020) for Dilated Exam, OCT.  There are no Patient Instructions on file for this visit.  Explained the diagnoses, plan, and follow up with the patient and they expressed understanding.  Patient expressed understanding of the importance of proper follow up care.   This document serves as a record of services personally performed by Gardiner Sleeper, MD, PhD. It was created on their behalf by Roselee Nova, COMT. The creation of this record is the provider's dictation and/or activities during the visit.  Electronically signed by: Roselee Nova, COMT 07/23/20 1:52 AM  This document serves as a record of services personally performed by Gardiner Sleeper, MD, PhD. It was created on their behalf by Estill Bakes, COT an ophthalmic technician. The creation of this record is the provider's dictation and/or activities during the visit.     Electronically signed by: Estill Bakes, COT 11.5.21 @ 1:52 AM  Gardiner Sleeper, M.D., Ph.D. Diseases & Surgery of the Retina and [place] 07/18/2020   I have reviewed the above documentation for accuracy and completeness, and I agree with the above. Gardiner Sleeper, M.D., Ph.D. 07/23/20 1:53 AM   Abbreviations: M myopia (nearsighted); A astigmatism; H hyperopia (farsighted); P presbyopia; Mrx spectacle prescription;  CTL contact lenses; OD right eye; OS left eye; OU both eyes  XT exotropia; ET esotropia; PEK punctate epithelial keratitis; PEE punctate epithelial erosions; DES dry eye syndrome; MGD meibomian gland dysfunction; ATs artificial tears; PFAT's preservative free artificial tears; Thayer nuclear sclerotic cataract; PSC posterior subcapsular cataract; ERM epi-retinal membrane; PVD posterior vitreous detachment; RD retinal detachment; DM diabetes mellitus; DR diabetic retinopathy; NPDR non-proliferative diabetic retinopathy; PDR proliferative diabetic retinopathy; CSME clinically significant macular edema; DME diabetic macular edema; dbh dot blot hemorrhages; CWS cotton wool spot; POAG primary open angle glaucoma; C/D cup-to-disc ratio; HVF humphrey visual field; GVF goldmann visual field; OCT optical coherence tomography; IOP intraocular pressure; BRVO  Branch retinal vein occlusion; CRVO central retinal vein occlusion; CRAO central retinal artery occlusion; BRAO branch retinal artery occlusion; RT retinal tear; SB scleral buckle; PPV pars plana vitrectomy; VH Vitreous hemorrhage; PRP panretinal laser photocoagulation; IVK intravitreal kenalog; VMT vitreomacular traction; MH Macular hole;  NVD neovascularization of the disc; NVE neovascularization elsewhere; AREDS age related eye disease study; ARMD age related macular degeneration; POAG primary open angle glaucoma; EBMD epithelial/anterior basement membrane dystrophy; ACIOL anterior chamber intraocular lens;  IOL intraocular lens; PCIOL posterior chamber intraocular lens; Phaco/IOL phacoemulsification with intraocular lens placement; Omro photorefractive keratectomy; LASIK laser assisted in situ keratomileusis; HTN hypertension; DM diabetes mellitus; COPD chronic obstructive pulmonary disease

## 2020-07-16 ENCOUNTER — Encounter (INDEPENDENT_AMBULATORY_CARE_PROVIDER_SITE_OTHER): Payer: Self-pay | Admitting: Nurse Practitioner

## 2020-07-16 NOTE — Progress Notes (Signed)
Subjective:    Patient ID: Mariah Cortez, female    DOB: 1951/08/29, 69 y.o.   MRN: [id number] Chief Complaint  Patient presents with  . Follow-up    U/S follow up    The patient presents today for follow-up examination of her lower extremity leg pain.  The patient notes that she has had this lower extremity leg pain for some time.  The patient notes that she has small areas in her calf with areas that are hard and knotted.  Patient also notes severe swelling in the bilateral lower extremities although she denies any open wounds or ulcerations.  She denies any claudication-like symptoms.  The patient has worn medical grade 1 compression previously although she does not wear it on a consistent basis.  Fever, chills, nausea, vomiting or diarrhea.  Today noninvasive studies show an ABI 1.07 on the right and 1.08 on the left.  The patient has triphasic tibial artery waveforms on the right and triphasic/biphasic on the left.  She has good toe waveforms bilaterally.  Today noninvasive studies show no evidence of DVT or superficial thrombophlebitis bilaterally.  No evidence of deep venous insufficiency seen bilaterally or superficial venous reflux seen bilaterally.   Review of Systems  Cardiovascular: Positive for leg swelling.  All other systems reviewed and are negative.      Objective:   Physical Exam Vitals reviewed.  Constitutional:      Appearance: She is obese.  HENT:     Head: Normocephalic.  Cardiovascular:     Rate and Rhythm: Normal rate.     Pulses: Decreased pulses.  Pulmonary:     Effort: Pulmonary effort is normal.  Musculoskeletal:     Right lower leg: 2+ Pitting Edema present.     Left lower leg: 2+ Pitting Edema present.  Neurological:     Mental Status: She is alert and oriented to person, place, and time.  Psychiatric:        Mood and Affect: Mood normal.        Behavior: Behavior normal.        Thought Content: Thought content normal.        Judgment:  Judgment normal.     BP (!) 159/90   Pulse 97   Ht 4\' 11"  (1.499 m)   Wt 269 lb (122 kg)   BMI 54.33 kg/m   Past Medical History:  Diagnosis Date  . Allergies   . Arthritis   . Diabetes mellitus   . GERD (gastroesophageal reflux disease)   . Headache   . Hyperlipidemia   . Hypertension   . Hypertensive retinopathy    OU  . Retinal detachment    right eye  . Stroke [facility])    " mild"  . Wears glasses     Social History   Socioeconomic History  . Marital status: Married    Spouse name: Not on file  . Number of children: Not on file  . Years of education: Not on file  . Highest education level: Not on file  Occupational History  . Not on file  Tobacco Use  . Smoking status: Never Smoker  . Smokeless tobacco: Never Used  Vaping Use  . Vaping Use: Never used  Substance and Sexual Activity  . Alcohol use: Yes    Comment: occassionaly  . Drug use: Never  . Sexual activity: Not on file  Other Topics Concern  . Not on file  Social History Narrative  . Not on file  Social Determinants of Health   Financial Resource Strain:   . Difficulty of Paying Living Expenses: Not on file  Food Insecurity:   . Worried About Programme researcher, broadcasting/film/videounning Out of Food in the Last Year: Not on file  . Ran Out of Food in the Last Year: Not on file  Transportation Needs:   . Lack of Transportation (Medical): Not on file  . Lack of Transportation (Non-Medical): Not on file  Physical Activity:   . Days of Exercise per Week: Not on file  . Minutes of Exercise per Session: Not on file  Stress:   . Feeling of Stress : Not on file  Social Connections:   . Frequency of Communication with Friends and Family: Not on file  . Frequency of Social Gatherings with Friends and Family: Not on file  . Attends Religious Services: Not on file  . Active Member of Clubs or Organizations: Not on file  . Attends BankerClub or Organization Meetings: Not on file  . Marital Status: Not on file  Intimate Partner Violence:    . Fear of Current or Ex-Partner: Not on file  . Emotionally Abused: Not on file  . Physically Abused: Not on file  . Sexually Abused: Not on file    Past Surgical History:  Procedure Laterality Date  . CATARACT EXTRACTION Bilateral    Dr. Vonna KotykBevis  . COLONOSCOPY    . EYE SURGERY Right 11/25/2019   SBP+PPV for RD repair - Dr. Rennis ChrisBrian Zamora  . EYE SURGERY Bilateral    Cat Sx - Dr. Vonna KotykBevis  . GAS/FLUID EXCHANGE Right 11/25/2019   Procedure: Gas/Fluid Exchange;  Surgeon: Rennis ChrisZamora, Brian, MD;  [facility];  Service: Ophthalmology;  Laterality: Right;  . JOINT REPLACEMENT     B/L  . LIPOMA RESECTION     Dr. Michela PitcherEly  . PHOTOCOAGULATION Right 11/25/2019   Procedure: Photocoagulation;  Surgeon: Rennis ChrisZamora, Brian, MD;  [facility];  Service: Ophthalmology;  Laterality: Right;  . REPLACEMENT TOTAL KNEE  2010   Right, Dr. Ernest PineHooten  . REPLACEMENT TOTAL KNEE  2013   left  . RETINAL DETACHMENT SURGERY Right 11/25/2019   SBP+PPV - Dr. Rennis ChrisBrian Zamora  . VITRECTOMY 25 GAUGE WITH SCLERAL BUCKLE Right 11/25/2019   Procedure: VITRECTOMY 25 GAUGE WITH SCLERAL BUCKLE;  Surgeon: Rennis ChrisZamora, Brian, MD;  [facility];  Service: Ophthalmology;  Laterality: Right;    Family History  Problem Relation Age of Onset  . Stroke Mother   . Hypertension Mother   . Diabetes Sister   . Arthritis Maternal Aunt     Allergies  Allergen Reactions  . Latex Rash    Latex tape  . Morphine And Related Itching    CBC Latest Ref Rng & Units 11/25/2019 01/08/2012 01/07/2012  WBC 4.0 - 10.5 K/uL 6.6 - -  Hemoglobin 12.0 - 15.0 g/dL 16.112.2 11.4(L) 11.4(L)  Hematocrit 36 - 46 % 38.1 - -  Platelets 150 - 400 K/uL 180 129(L) 133(L)      CMP     Component Value Date/Time   NA 140 11/25/2019 1026   NA 141 01/08/2012 0402   K 4.1 11/25/2019 1026   K 3.7 01/08/2012 0402   CL 103 11/25/2019 1026   CL 103 01/08/2012 0402   CO2 26 11/25/2019 1026   CO2 31 01/08/2012 0402   GLUCOSE 155 (H) 11/25/2019 1026   GLUCOSE 165 (H)  01/08/2012 0402   BUN 22 11/25/2019 1026   BUN 7 01/08/2012 0402   CREATININE 0.92 11/25/2019  1026   CREATININE 0.57 (L) 01/08/2012 0402   CALCIUM 10.1 11/25/2019 1026   CALCIUM 9.1 01/08/2012 0402   GFRNONAA >60 11/25/2019 1026   GFRNONAA >60 01/08/2012 0402   GFRAA >60 11/25/2019 1026   GFRAA >60 01/08/2012 0402          Assessment & Plan:   1. Lymphedema The notes in the patient's lower extremities are likely related to swelling in her lower extremities.  I discussed the patient about being placed in bilateral Unna wraps however the patient did not wish to do so at this time.  A lymphedema pump was also discussed with the patient however she did not wish to move forward with this at this moment.  The patient is advised to wear medical grade 1 compression stockings on a daily basis.  Compression wraps were also discussed.  Patient also advised to elevate her lower extremities much as possible as well as to ambulate.  Patient will follow up in 3 months for evaluation with conservative therapy tactics.  2. Mixed hyperlipidemia Continue statin as ordered and reviewed, no changes at this time   3. Primary hypertension Continue antihypertensive medications as already ordered, these medications have been reviewed and there are no changes at this time.   4. Pain in both lower extremities Recommend:  I do not find evidence of Vascular pathology that would explain the patient's symptoms  The patient has atypical pain symptoms for vascular disease  I do not find evidence of Vascular pathology that would explain the patient's symptoms and I suspect the patient is c/o pseudoclaudication.  Patient should have an evaluation of his LS spine which I defer to the primary service.  Noninvasive studies including venous ultrasound of the legs do not identify vascular problems  The patient should continue walking and begin a more formal exercise program. The patient should continue his  antiplatelet therapy and aggressive treatment of the lipid abnormalities. The patient should begin wearing graduated compression socks 15-20 mmHg strength to control her mild edema.  Patient will follow-up with me on a PRN basis  Further work-up of her lower extremity pain is deferred to the primary service      Current Outpatient Medications on File Prior to Visit  Medication Sig Dispense Refill  . ACCU-CHEK GUIDE test strip     . amLODipine (NORVASC) 5 MG tablet     . APPLE CIDER VINEGAR PO Take 450 mg by mouth daily.    . Ascorbic Acid (VITAMIN C CR) 1000 MG TBCR Take 1,000 mg by mouth daily.     [name] aspirin 81 MG tablet Take 81 mg by mouth daily.      . bacitracin-polymyxin b (POLYSPORIN) ophthalmic ointment Place into the right eye at bedtime. Place a 1/2 inch ribbon of ointment into the lower eyelid. 3.5 g 3  . brimonidine (ALPHAGAN) 0.2 % ophthalmic solution Place 1 drop into the right eye in the morning and at bedtime. 15 mL 1  . cetirizine (ZYRTEC) 10 MG tablet Take 10 mg by mouth daily.      . Cholecalciferol (VITAMIN D3) 50 MCG (2000 UT) TABS Take 2,000 mg by mouth daily.    . Cysteamine Bitartrate (PROCYSBI) 300 MG PACK by XX route as directed    . esomeprazole (NEXIUM) 20 MG capsule Take 20 mg by mouth daily as needed (Heartburn).    . Ferrous Sulfate (IRON) 325 (65 FE) MG TABS Take 325 mg by mouth in the morning and at bedtime.     [name]  furosemide (LASIX) 40 MG tablet Take by mouth.    . gatifloxacin (ZYMAXID) 0.5 % SOLN INSTILL ONE DROP INTO THE RIGHT EYE FOUR TIMES DAILY 3 mL 0  . glimepiride (AMARYL) 4 MG tablet Take 4 mg by mouth 2 (two) times daily.    . hydrochlorothiazide (HYDRODIURIL) 25 MG tablet Take 25 mg by mouth daily.    [name] HYDROcodone-acetaminophen (NORCO/VICODIN) 5-325 MG tablet Take 1 tablet by mouth every 4 (four) hours as needed for moderate pain. 20 tablet 0  . losartan (COZAAR) 50 MG tablet Take 50 mg by mouth daily.    [name] lovastatin (MEVACOR) 40 MG tablet  Take 40 mg by mouth 2 (two) times daily.      . meloxicam (MOBIC) 7.5 MG tablet Take 7.5 mg by mouth daily.    . metFORMIN (GLUCOPHAGE) 1000 MG tablet Take 1,000 mg by mouth in the morning and at bedtime.     . Misc Natural Products (OSTEO BI-FLEX JOINT SHIELD PO) Take 1 tablet by mouth in the morning and at bedtime.     . Multiple Vitamin (MULTIVITAMIN) capsule Take 1 capsule by mouth daily.      . pantoprazole (PROTONIX) 40 MG tablet Take 40 mg by mouth daily.    . pioglitazone (ACTOS) 30 MG tablet Take 30 mg by mouth daily.    . Potassium 99 MG TABS Take 99 mg by mouth daily.    . traMADol (ULTRAM) 50 MG tablet Take 50 mg by mouth 2 (two) times daily. Additional  50 mg if needed    . valACYclovir (VALTREX) 1000 MG tablet Take 1 tablet (1,000 mg total) by mouth 3 (three) times daily. 90 tablet 2  . vitamin B-12 (CYANOCOBALAMIN) 1000 MCG tablet Take 1,000 mcg by mouth daily.     Current Facility-Administered Medications on File Prior to Visit  Medication Dose Route Frequency Provider Last Rate Last Admin  . gatifloxacin (ZYMAXID) 0.5 % ophthalmic drops 1 drop  1 drop Right Eye QID Rennis Chris, MD        There are no Patient Instructions on file for this visit. No follow-ups on file.   Georgiana Spinner, NP

## 2020-07-18 ENCOUNTER — Ambulatory Visit (INDEPENDENT_AMBULATORY_CARE_PROVIDER_SITE_OTHER): Payer: Medicare PPO | Admitting: Ophthalmology

## 2020-07-18 ENCOUNTER — Other Ambulatory Visit: Payer: Self-pay

## 2020-07-18 DIAGNOSIS — I10 Essential (primary) hypertension: Secondary | ICD-10-CM

## 2020-07-18 DIAGNOSIS — H35033 Hypertensive retinopathy, bilateral: Secondary | ICD-10-CM

## 2020-07-18 DIAGNOSIS — H3321 Serous retinal detachment, right eye: Secondary | ICD-10-CM

## 2020-07-18 DIAGNOSIS — Z961 Presence of intraocular lens: Secondary | ICD-10-CM

## 2020-07-18 DIAGNOSIS — H3581 Retinal edema: Secondary | ICD-10-CM | POA: Diagnosis not present

## 2020-07-18 DIAGNOSIS — H35371 Puckering of macula, right eye: Secondary | ICD-10-CM

## 2020-07-23 ENCOUNTER — Encounter (INDEPENDENT_AMBULATORY_CARE_PROVIDER_SITE_OTHER): Payer: Self-pay | Admitting: Ophthalmology

## 2020-08-08 ENCOUNTER — Encounter (INDEPENDENT_AMBULATORY_CARE_PROVIDER_SITE_OTHER): Payer: Medicare PPO | Admitting: Ophthalmology

## 2020-08-08 ENCOUNTER — Encounter (INDEPENDENT_AMBULATORY_CARE_PROVIDER_SITE_OTHER): Payer: Self-pay

## 2020-09-21 DIAGNOSIS — I10 Essential (primary) hypertension: Secondary | ICD-10-CM | POA: Diagnosis not present

## 2020-09-21 DIAGNOSIS — E782 Mixed hyperlipidemia: Secondary | ICD-10-CM | POA: Diagnosis not present

## 2020-09-21 DIAGNOSIS — R0600 Dyspnea, unspecified: Secondary | ICD-10-CM | POA: Diagnosis not present

## 2020-09-21 DIAGNOSIS — J9811 Atelectasis: Secondary | ICD-10-CM | POA: Diagnosis not present

## 2020-09-21 DIAGNOSIS — R809 Proteinuria, unspecified: Secondary | ICD-10-CM | POA: Diagnosis not present

## 2020-09-21 DIAGNOSIS — R0602 Shortness of breath: Secondary | ICD-10-CM | POA: Diagnosis not present

## 2020-09-21 DIAGNOSIS — Z6841 Body Mass Index (BMI) 40.0 and over, adult: Secondary | ICD-10-CM | POA: Diagnosis not present

## 2020-09-21 DIAGNOSIS — M7989 Other specified soft tissue disorders: Secondary | ICD-10-CM | POA: Diagnosis not present

## 2020-09-21 DIAGNOSIS — E1129 Type 2 diabetes mellitus with other diabetic kidney complication: Secondary | ICD-10-CM | POA: Diagnosis not present

## 2020-10-02 DIAGNOSIS — E119 Type 2 diabetes mellitus without complications: Secondary | ICD-10-CM | POA: Diagnosis not present

## 2020-10-02 DIAGNOSIS — Z0100 Encounter for examination of eyes and vision without abnormal findings: Secondary | ICD-10-CM | POA: Diagnosis not present

## 2020-10-02 DIAGNOSIS — H33039 Retinal detachment with giant retinal tear, unspecified eye: Secondary | ICD-10-CM | POA: Diagnosis not present

## 2020-10-02 DIAGNOSIS — H2513 Age-related nuclear cataract, bilateral: Secondary | ICD-10-CM | POA: Diagnosis not present

## 2020-10-03 DIAGNOSIS — I10 Essential (primary) hypertension: Secondary | ICD-10-CM | POA: Diagnosis not present

## 2020-10-03 DIAGNOSIS — G4730 Sleep apnea, unspecified: Secondary | ICD-10-CM | POA: Diagnosis not present

## 2020-10-03 DIAGNOSIS — R0602 Shortness of breath: Secondary | ICD-10-CM | POA: Insufficient documentation

## 2020-10-03 DIAGNOSIS — Z6841 Body Mass Index (BMI) 40.0 and over, adult: Secondary | ICD-10-CM | POA: Diagnosis not present

## 2020-10-03 DIAGNOSIS — R809 Proteinuria, unspecified: Secondary | ICD-10-CM | POA: Diagnosis not present

## 2020-10-03 DIAGNOSIS — E782 Mixed hyperlipidemia: Secondary | ICD-10-CM | POA: Diagnosis not present

## 2020-10-03 DIAGNOSIS — E1129 Type 2 diabetes mellitus with other diabetic kidney complication: Secondary | ICD-10-CM | POA: Diagnosis not present

## 2020-10-12 ENCOUNTER — Ambulatory Visit (INDEPENDENT_AMBULATORY_CARE_PROVIDER_SITE_OTHER): Payer: Medicare PPO | Admitting: Vascular Surgery

## 2020-10-23 ENCOUNTER — Ambulatory Visit (INDEPENDENT_AMBULATORY_CARE_PROVIDER_SITE_OTHER): Payer: Medicare HMO | Admitting: Vascular Surgery

## 2020-10-23 ENCOUNTER — Other Ambulatory Visit: Payer: Self-pay

## 2020-10-23 VITALS — BP 174/93 | HR 97 | Ht 59.0 in | Wt 275.0 lb

## 2020-10-23 DIAGNOSIS — I872 Venous insufficiency (chronic) (peripheral): Secondary | ICD-10-CM | POA: Diagnosis not present

## 2020-10-23 DIAGNOSIS — E782 Mixed hyperlipidemia: Secondary | ICD-10-CM

## 2020-10-23 DIAGNOSIS — I890 Lymphedema, not elsewhere classified: Secondary | ICD-10-CM

## 2020-10-23 DIAGNOSIS — I10 Essential (primary) hypertension: Secondary | ICD-10-CM | POA: Diagnosis not present

## 2020-10-23 NOTE — Progress Notes (Signed)
MRN : [id number]  Mariah Cortez is a 70 y.o. ([birth year]) female who presents with chief complaint of  [id number] Complaint  Patient presents with  . Follow-up    3 mo no studies  .  History of Present Illness:   The patient returns to the office for followup evaluation regarding leg swelling.  The swelling has persisted and the pain associated with swelling continues. There have not been any interval development of a ulcerations or wounds.  Since the previous visit the patient has been wearing graduated compression stockings and has noted little if any improvement in the lymphedema. The patient has been using compression routinely morning until night.  The patient also states elevation during the day and exercise is being done too.   Current Meds  Medication Sig  . ACCU-CHEK GUIDE test strip   . APPLE CIDER VINEGAR PO Take 450 mg by mouth daily.  . Ascorbic Acid (VITAMIN C CR) 1000 MG TBCR Take 1,000 mg by mouth daily.  [name]. aspirin 81 MG tablet Take 81 mg by mouth daily.  . bacitracin-polymyxin b (POLYSPORIN) ophthalmic ointment Place into the right eye at bedtime. Place a 1/2 inch ribbon of ointment into the lower eyelid.  [name]. brimonidine (ALPHAGAN) 0.2 % ophthalmic solution Place 1 drop into the right eye in the morning and at bedtime.  . cetirizine (ZYRTEC) 10 MG tablet Take 10 mg by mouth daily.  . Cholecalciferol (VITAMIN D3) 50 MCG (2000 UT) TABS Take 2,000 mg by mouth daily.  [name]. esomeprazole (NEXIUM) 20 MG capsule Take 20 mg by mouth daily as needed (Heartburn).  . Ferrous Sulfate (IRON) 325 (65 FE) MG TABS Take 325 mg by mouth in the morning and at bedtime.  . furosemide (LASIX) 40 MG tablet Take by mouth.  . gatifloxacin (ZYMAXID) 0.5 % SOLN INSTILL ONE DROP INTO THE RIGHT EYE FOUR TIMES DAILY  . glimepiride (AMARYL) 4 MG tablet Take 4 mg by mouth 2 (two) times daily.  . hydrochlorothiazide (HYDRODIURIL) 25 MG tablet Take 25 mg by mouth daily.  [name]. HYDROcodone-acetaminophen  (NORCO/VICODIN) 5-325 MG tablet Take 1 tablet by mouth every 4 (four) hours as needed for moderate pain.  [name]. losartan (COZAAR) 100 MG tablet Take by mouth.  . lovastatin (MEVACOR) 40 MG tablet Take 40 mg by mouth 2 (two) times daily.  . meloxicam (MOBIC) 7.5 MG tablet Take 7.5 mg by mouth daily.  . metFORMIN (GLUCOPHAGE) 1000 MG tablet Take 1,000 mg by mouth in the morning and at bedtime.   . Misc Natural Products (OSTEO BI-FLEX JOINT SHIELD PO) Take 1 tablet by mouth in the morning and at bedtime.  . Multiple Vitamin (MULTIVITAMIN) capsule Take 1 capsule by mouth daily.  . pantoprazole (PROTONIX) 40 MG tablet Take 40 mg by mouth daily.  . pioglitazone (ACTOS) 30 MG tablet Take 30 mg by mouth daily.  . Potassium 99 MG TABS Take 99 mg by mouth daily.  . traMADol (ULTRAM) 50 MG tablet Take 50 mg by mouth 2 (two) times daily. Additional  50 mg if needed  . valACYclovir (VALTREX) 1000 MG tablet Take 1 tablet (1,000 mg total) by mouth 3 (three) times daily.  . vitamin B-12 (CYANOCOBALAMIN) 1000 MCG tablet Take 1,000 mcg by mouth daily.  . [DISCONTINUED] losartan (COZAAR) 50 MG tablet Take 50 mg by mouth daily.   Current Facility-Administered Medications for the 10/23/20 encounter (Office Visit) with Gilda CreaseSchnier, Latina CraverGregory G, MD  Medication  . gatifloxacin (ZYMAXID) 0.5 % ophthalmic drops 1  drop    Past Medical History:  Diagnosis Date  . Allergies   . Arthritis   . Diabetes mellitus   . GERD (gastroesophageal reflux disease)   . Headache   . Hyperlipidemia   . Hypertension   . Hypertensive retinopathy    OU  . Retinal detachment    right eye  . Stroke [facility])    " mild"  . Wears glasses     Past Surgical History:  Procedure Laterality Date  . CATARACT EXTRACTION Bilateral    Dr. Vonna Kotyk  . COLONOSCOPY    . EYE SURGERY Right 11/25/2019   SBP+PPV for RD repair - Dr. Rennis Chris  . EYE SURGERY Bilateral    Cat Sx - Dr. Vonna Kotyk  . GAS/FLUID EXCHANGE Right 11/25/2019   Procedure: Gas/Fluid  Exchange;  Surgeon: Rennis Chris, MD;  Location[facility] OR;  Service: Ophthalmology;  Laterality: Right;  . JOINT REPLACEMENT     B/L  . LIPOMA RESECTION     Dr. Michela Pitcher  . PHOTOCOAGULATION Right 11/25/2019   Procedure: Photocoagulation;  Surgeon: Rennis Chris, MD;  Location: [facility] OR;  Service: Ophthalmology;  Laterality: Right;  . REPLACEMENT TOTAL KNEE  2010   Right, Dr. Ernest Pine  . REPLACEMENT TOTAL KNEE  2013   left  . RETINAL DETACHMENT SURGERY Right 11/25/2019   SBP+PPV - Dr. Rennis Chris  . VITRECTOMY 25 GAUGE WITH SCLERAL BUCKLE Right 11/25/2019   Procedure: VITRECTOMY 25 GAUGE WITH SCLERAL BUCKLE;  Surgeon: Rennis Chris, MD;  Location: [facility] OR;  Service: Ophthalmology;  Laterality: Right;    Social History Social History   Tobacco Use  . Smoking status: Never Smoker  . Smokeless tobacco: Never Used  Vaping Use  . Vaping Use: Never used  Substance Use Topics  . Alcohol use: Yes    Comment: occassionaly  . Drug use: Never    Family History Family History  Problem Relation Age of Onset  . Stroke Mother   . Hypertension Mother   . Diabetes Sister   . Arthritis Maternal Aunt     Allergies  Allergen Reactions  . Latex Rash    Latex tape  . Morphine And Related Itching     REVIEW OF SYSTEMS (Negative unless checked)  Constitutional: [] Weight loss  [] Fever  [] Chills Cardiac: [] Chest pain   [] Chest pressure   [] Palpitations   [] Shortness of breath when laying flat   [] Shortness of breath with exertion. Vascular:  [] Pain in legs with walking   [x] Pain in legs at rest  [] History of DVT   [] Phlebitis   [x] Swelling in legs   [] Varicose veins   [] Non-healing ulcers Pulmonary:   [] Uses home oxygen   [] Productive cough   [] Hemoptysis   [] Wheeze  [] COPD   [] Asthma Neurologic:  [] Dizziness   [] Seizures   [] History of stroke   [] History of TIA  [] Aphasia   [] Vissual changes   [] Weakness or numbness in arm   [] Weakness or numbness in leg Musculoskeletal:   [] Joint swelling   [] Joint  pain   [] Low back pain Hematologic:  [] Easy bruising  [] Easy bleeding   [] Hypercoagulable state   [] Anemic Gastrointestinal:  [] Diarrhea   [] Vomiting  [] Gastroesophageal reflux/heartburn   [] Difficulty swallowing. Genitourinary:  [] Chronic kidney disease   [] Difficult urination  [] Frequent urination   [] Blood in urine Skin:  [x] Rashes   [] Ulcers  Psychological:  [] History of anxiety   []  History of major depression.  Physical Examination  Vitals:   10/23/20 1458  BP: (!) 174/93  Pulse: 97  Weight: 275 lb (124.7 kg)  Height: 4\' 11"  (1.499 m)   Body mass index is 55.54 kg/m. Gen: WD/WN, NAD Head: [place]/AT, No temporalis wasting.  Ear/Nose/Throat: Hearing grossly intact, nares w/o erythema or drainage Eyes: PER, EOMI, sclera nonicteric.  Neck: Supple, no large masses.   Pulmonary:  Good air movement, no audible wheezing bilaterally, no use of accessory muscles.  Cardiac: RRR, no JVD Vascular: scattered varicosities present bilaterally.  moderate venous stasis changes to the legs bilaterally.  3-4+ firm pitting edema. Vessel Right Left  Radial Palpable Palpable  Gastrointestinal: Non-distended. No guarding/no peritoneal signs.  Musculoskeletal: M/S 5/5 throughout.  No deformity or atrophy.  Neurologic: CN 2-12 intact. Symmetrical.  Speech is fluent. Motor exam as listed above. Psychiatric: Judgment intact, Mood & affect appropriate for pt's clinical situation. Dermatologic: Moderate venous rashes no ulcers noted.  No changes consistent with cellulitis. Lymph : + lichenification / skin changes of chronic lymphedema.  CBC Lab Results  Component Value Date   WBC 6.6 11/25/2019   HGB 12.2 11/25/2019   HCT 38.1 11/25/2019   MCV 96.5 11/25/2019   PLT 180 11/25/2019    BMET    Component Value Date/Time   NA 140 11/25/2019 1026   NA 141 01/08/2012 0402   K 4.1 11/25/2019 1026   K 3.7 01/08/2012 0402   CL 103 11/25/2019 1026   CL 103 01/08/2012 0402   CO2 26 11/25/2019 1026    CO2 31 01/08/2012 0402   GLUCOSE 155 (H) 11/25/2019 1026   GLUCOSE 165 (H) 01/08/2012 0402   BUN 22 11/25/2019 1026   BUN 7 01/08/2012 0402   CREATININE 0.92 11/25/2019 1026   CREATININE 0.57 (L) 01/08/2012 0402   CALCIUM 10.1 11/25/2019 1026   CALCIUM 9.1 01/08/2012 0402   GFRNONAA >60 11/25/2019 1026   GFRNONAA >60 01/08/2012 0402   GFRAA >60 11/25/2019 1026   GFRAA >60 01/08/2012 0402   CrCl cannot be calculated (Patient's most recent lab result is older than the maximum 21 days allowed.).  COAG Lab Results  Component Value Date   INR 0.9 12/24/2011    Radiology No results found.    Assessment/Plan 1. Lymphedema Recommend:  No surgery or intervention at this point in time.    I have reviewed my previous discussion with the patient regarding swelling and why it causes symptoms.  Patient will continue wearing graduated compression stockings class 1 (20-30 mmHg) on a daily basis. The patient will  beginning wearing the stockings first thing in the morning and removing them in the evening. The patient is instructed specifically not to sleep in the stockings.    In addition, behavioral modification including several periods of elevation of the lower extremities during the day will be continued.  This was reviewed with the patient during the initial visit.  The patient will also continue routine exercise, especially walking on a daily basis as was discussed during the initial visit.    Despite conservative treatments including graduated compression therapy class 1 and behavioral modification including exercise and elevation the patient  has not obtained adequate control of the lymphedema.  The patient still has stage 3 lymphedema and therefore, I believe that a lymph pump should be added to improve the control of the patient's lymphedema.  Additionally, a lymph pump is warranted because it will reduce the risk of cellulitis and ulceration in the future.  Patient should follow-up  in six months    2. Chronic venous insufficiency Recommend:  No surgery or intervention at this point in time.    I have reviewed my previous discussion with the patient regarding swelling and why it causes symptoms.  Patient will continue wearing graduated compression stockings class 1 (20-30 mmHg) on a daily basis. The patient will  beginning wearing the stockings first thing in the morning and removing them in the evening. The patient is instructed specifically not to sleep in the stockings.    In addition, behavioral modification including several periods of elevation of the lower extremities during the day will be continued.  This was reviewed with the patient during the initial visit.  The patient will also continue routine exercise, especially walking on a daily basis as was discussed during the initial visit.    Despite conservative treatments including graduated compression therapy class 1 and behavioral modification including exercise and elevation the patient  has not obtained adequate control of the lymphedema.  The patient still has stage 3 lymphedema and therefore, I believe that a lymph pump should be added to improve the control of the patient's lymphedema.  Additionally, a lymph pump is warranted because it will reduce the risk of cellulitis and ulceration in the future.  Patient should follow-up in six months    3. Benign essential hypertension Continue antihypertensive medications as already ordered, these medications have been reviewed and there are no changes at this time.   4. Mixed hyperlipidemia Continue statin as ordered and reviewed, no changes at this time    Levora Dredge, MD  10/23/2020 3:19 PM

## 2020-11-03 ENCOUNTER — Encounter (INDEPENDENT_AMBULATORY_CARE_PROVIDER_SITE_OTHER): Payer: Self-pay | Admitting: Vascular Surgery

## 2020-11-14 DIAGNOSIS — Z6841 Body Mass Index (BMI) 40.0 and over, adult: Secondary | ICD-10-CM | POA: Diagnosis not present

## 2020-11-14 DIAGNOSIS — E782 Mixed hyperlipidemia: Secondary | ICD-10-CM | POA: Diagnosis not present

## 2020-11-14 DIAGNOSIS — R809 Proteinuria, unspecified: Secondary | ICD-10-CM | POA: Diagnosis not present

## 2020-11-14 DIAGNOSIS — I739 Peripheral vascular disease, unspecified: Secondary | ICD-10-CM | POA: Diagnosis not present

## 2020-11-14 DIAGNOSIS — I10 Essential (primary) hypertension: Secondary | ICD-10-CM | POA: Diagnosis not present

## 2020-11-14 DIAGNOSIS — R0602 Shortness of breath: Secondary | ICD-10-CM | POA: Diagnosis not present

## 2020-11-14 DIAGNOSIS — E1129 Type 2 diabetes mellitus with other diabetic kidney complication: Secondary | ICD-10-CM | POA: Diagnosis not present

## 2020-11-14 NOTE — Progress Notes (Addendum)
[facility] Note  11/15/2020     CHIEF COMPLAINT Patient presents for Retina Follow Up   HISTORY OF PRESENT ILLNESS: Mariah Cortez is a 70 y.o. female who presents to the clinic today for:   HPI    Retina Follow Up    Patient presents with  Retinal Break/Detachment.  In right eye.  This started weeks ago.  Severity is moderate.  Duration of weeks.  Since onset it is stable.  I, the attending physician,  performed the HPI with the patient and updated documentation appropriately.          Comments    Pt states vision is better OD.  Pt denies eye pain or discomfort and denies any new or worsening floaters or fol OU.       Last edited by [name], MD on 11/15/2020  2:47 PM. (History)    Patient states her vision is doing better, she was using AT's QID OU, but her eyes felt like they had a film on them, she is now only using them BID, she states she can't see out of her new glasses to read   Referring physician: Anell Barr, [place],  [place] [id number]  HISTORICAL INFORMATION:   Selected notes from the MEDICAL RECORD NUMBER Referred by Dr. Orion Modest for concern of mac off RD   CURRENT MEDICATIONS: Current Outpatient Medications (Ophthalmic Drugs)  Medication Sig  . bacitracin-polymyxin b (POLYSPORIN) ophthalmic ointment Place into the right eye at bedtime. Place a 1/2 inch ribbon of ointment into the lower eyelid.  [name] brimonidine (ALPHAGAN) 0.2 % ophthalmic solution Place 1 drop into the right eye in the morning and at bedtime.  [name] gatifloxacin (ZYMAXID) 0.5 % SOLN INSTILL ONE DROP INTO THE RIGHT EYE FOUR TIMES DAILY   Current Facility-Administered Medications (Ophthalmic Drugs)  Medication Route  . gatifloxacin (ZYMAXID) 0.5 % ophthalmic drops 1 drop Right Eye   Current Outpatient Medications (Other)  Medication Sig  . ACCU-CHEK GUIDE test strip   . amLODipine (NORVASC) 5 MG tablet  (Patient not taking: No sig reported)  .  APPLE CIDER VINEGAR PO Take 450 mg by mouth daily.  . Ascorbic Acid (VITAMIN C CR) 1000 MG TBCR Take 1,000 mg by mouth daily.  [name] aspirin 81 MG tablet Take 81 mg by mouth daily.  . cetirizine (ZYRTEC) 10 MG tablet Take 10 mg by mouth daily.  . Cholecalciferol (VITAMIN D3) 50 MCG (2000 UT) TABS Take 2,000 mg by mouth daily.  [name] esomeprazole (NEXIUM) 20 MG capsule Take 20 mg by mouth daily as needed (Heartburn).  . Ferrous Sulfate (IRON) 325 (65 FE) MG TABS Take 325 mg by mouth in the morning and at bedtime.  . furosemide (LASIX) 40 MG tablet Take by mouth.  [name] glimepiride (AMARYL) 4 MG tablet Take 4 mg by mouth 2 (two) times daily.  . hydrochlorothiazide (HYDRODIURIL) 25 MG tablet Take 25 mg by mouth daily.  [name] HYDROcodone-acetaminophen (NORCO/VICODIN) 5-325 MG tablet Take 1 tablet by mouth every 4 (four) hours as needed for moderate pain.  [name] losartan (COZAAR) 100 MG tablet Take by mouth.  . lovastatin (MEVACOR) 40 MG tablet Take 40 mg by mouth 2 (two) times daily.  . meloxicam (MOBIC) 7.5 MG tablet Take 7.5 mg by mouth daily.  . metFORMIN (GLUCOPHAGE) 1000 MG tablet Take 1,000 mg by mouth in the morning and at bedtime.   . Misc Natural Products (OSTEO BI-FLEX JOINT  SHIELD PO) Take 1 tablet by mouth in the morning and at bedtime.  . Multiple Vitamin (MULTIVITAMIN) capsule Take 1 capsule by mouth daily.  . pantoprazole (PROTONIX) 40 MG tablet Take 40 mg by mouth daily.  . pioglitazone (ACTOS) 30 MG tablet Take 30 mg by mouth daily.  . Potassium 99 MG TABS Take 99 mg by mouth daily.  . traMADol (ULTRAM) 50 MG tablet Take 50 mg by mouth 2 (two) times daily. Additional  50 mg if needed  . valACYclovir (VALTREX) 1000 MG tablet Take 1 tablet (1,000 mg total) by mouth 3 (three) times daily.  . vitamin B-12 (CYANOCOBALAMIN) 1000 MCG tablet Take 1,000 mcg by mouth daily.   No current facility-administered medications for this visit. (Other)   REVIEW OF SYSTEMS: ROS    Positive for: Gastrointestinal,  Neurological, Endocrine, Eyes   Negative for: Constitutional, Skin, Genitourinary, Musculoskeletal, HENT, Cardiovascular, Respiratory, Psychiatric, Allergic/Imm, Heme/Lymph   Last edited by [name] on 11/15/2020  1:38 PM. (History)     ALLERGIES Allergies  Allergen Reactions  . Latex Rash    Latex tape  . Morphine And Related Itching   PAST MEDICAL HISTORY Past Medical History:  Diagnosis Date  . Allergies   . Arthritis   . Diabetes mellitus   . GERD (gastroesophageal reflux disease)   . Headache   . Hyperlipidemia   . Hypertension   . Hypertensive retinopathy    OU  . Retinal detachment    right eye  . Stroke [facility])    " mild"  . Wears glasses    Past Surgical History:  Procedure Laterality Date  . CATARACT EXTRACTION Bilateral    Dr. Talbert Forest  . COLONOSCOPY    . EYE SURGERY Right 11/25/2019   SBP+PPV for RD repair - Dr. [name]  . EYE SURGERY Bilateral    Cat Sx - Dr. Talbert Forest  . GAS/FLUID EXCHANGE Right 11/25/2019   Procedure: Gas/Fluid Exchange;  Surgeon: [name], MD;  Location: [place];  Service: Ophthalmology;  Laterality: Right;  . JOINT REPLACEMENT     B/L  . LIPOMA RESECTION     Dr. [name]  . PHOTOCOAGULATION Right 11/25/2019   Procedure: Photocoagulation;  Surgeon: [name], MD;  Location: [place];  Service: Ophthalmology;  Laterality: Right;  . REPLACEMENT TOTAL KNEE  2010   Right, Dr. [name]  . REPLACEMENT TOTAL KNEE  2013   left  . RETINAL DETACHMENT SURGERY Right 11/25/2019   SBP+PPV - Dr. [name]  . VITRECTOMY 25 GAUGE WITH SCLERAL BUCKLE Right 11/25/2019   Procedure: VITRECTOMY 25 GAUGE WITH SCLERAL BUCKLE;  Surgeon: [name], MD;  Location: [place];  Service: Ophthalmology;  Laterality: Right;   FAMILY HISTORY Family History  Problem Relation Age of Onset  . Stroke Mother   . Hypertension Mother   . Diabetes Sister   . Arthritis Maternal Aunt    SOCIAL HISTORY Social History   Tobacco Use  . Smoking status: Never  Smoker  . Smokeless tobacco: Never Used  Vaping Use  . Vaping Use: Never used  Substance Use Topics  . Alcohol use: Yes    Comment: occassionaly  . Drug use: Never         OPHTHALMIC EXAM:  Base Eye Exam    Visual Acuity (Snellen - Linear)      Right Left   Dist cc 20/25 +1 20/20 -2   Dist ph cc NI    Correction: Glasses  Tonometry (Tonopen, 1:48 PM)      Right Left   Pressure 16 16       Pupils      Dark Light Shape React APD   Right 3 2 Round Minimal 0   Left 3 2 Round Minimal 0       Visual Fields      Left Right    Full Full       Extraocular Movement      Right Left    Full Full       Neuro/Psych    Oriented x3: Yes   Mood/Affect: Normal       Dilation    Both eyes: 1.0% Mydriacyl, 2.5% Phenylephrine @ 1:48 PM        Slit Lamp and Fundus Exam    Slit Lamp Exam      Right Left   Lids/Lashes Dermatochalasis - upper lid Dermatochalasis - upper lid   Conjunctiva/Sclera White and quiet White and quiet   Cornea No epi defect, 2-3+Punctate epithelial erosions, mild corneal haze, well healed temporal cataract wounds, decreased TBUT Trace Punctate epithelial erosions, well healed temporal cataract wounds   Anterior Chamber Deep and quiet Deep and quiet   Iris Round and dilated, irregular, focal Posterior synechiae at 0400 Round and dilated, No NVI   Lens PC IOL in good position, open PC PC IOL in good position   Vitreous post vitrectomy, trace pigment, mild vitreous debris Vitreous syneresis, Posterior vitreous detachment       Fundus Exam      Right Left   Disc mild Pallor, Sharp rim mild Pallor, Sharp rim   C/D Ratio 0.2 0.3   Macula Flat/reattached, good foveal reflex, RPE mottling, focal punctate PED superior to fovea, no heme, mild ERM Flat, Blunted foveal reflex, Retinal pigment epithelial mottling, No heme or edema   Vessels attenuated, Tortuous attenuated, Tortuous   Periphery retina attached over buckle, good buckle height, good laser  over buckle 360; mild ERM and fibrosis inferiorly -- improving ORIGINALLY: Inferior detachment from 0200-1030, Subretinal band IT midzone, ?small tear at 1030 Attached, 2 focal areas of pigment clumping temporally, No RT/RD on 360 exam          IMAGING AND PROCEDURES  Imaging and Procedures for _0 @  OCT, Retina - OU - Both Eyes       Right Eye Quality was good. Central Foveal Thickness: 278. Progression has improved. Findings include no SRF, outer retinal atrophy, epiretinal membrane, normal foveal contour, no IRF, pigment epithelial detachment (Stable improvement in foveal profile and interval improvement in inferior ERM/edema).   Left Eye Quality was good. Central Foveal Thickness: 256. Progression has been stable. Findings include normal foveal contour, no IRF, no SRF.   Notes *Images captured and stored on drive  Diagnosis / Impression:  OD: retina stably re-attached; +ERM -- Stable improvement in foveal profile and interval improvement in inferior ERM/edema OS: NFP, no IRF/SRF  Clinical management:  See below  Abbreviations: NFP - Normal foveal profile. CME - cystoid macular edema. PED - pigment epithelial detachment. IRF - intraretinal fluid. SRF - subretinal fluid. EZ - ellipsoid zone. ERM - epiretinal membrane. ORA - outer retinal atrophy. ORT - outer retinal tubulation. SRHM - subretinal hyper-reflective material                 ASSESSMENT/PLAN:    ICD-10-CM   1. Right retinal detachment  H33.21   2. Retinal edema  H35.81 OCT, Retina - OU -  Both Eyes  3. Epiretinal membrane (ERM) of right eye  H35.371   4. Essential hypertension  I10   5. Hypertensive retinopathy of both eyes  H35.033   6. Pseudophakia of both eyes  Z96.1     1,2. 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 Epiretinal membrane, right eye   - very peripheral ERM -- non central  - no metamorphopsia - BCVA 20/25 OD  - OCT shows interval improvement in inferior ERM and retinal edema  - no indication for surgery at this time   - recommend monitoring for now  4,5. Hypertensive retinopathy OU - discussed importance of tight BP control - monitor  6. Pseudophakia OU w/ PCO OD  - s/p CE/IOL OU (Dr. Talbert Forest, OD: 07.19.21, OS: 08.09.21)  - IOLs in good position, doing well  - s/p YAG cap OD 10.27.21 -- PC nicely open  - f/u 6 months   Ophthalmic Meds Ordered this visit:  No orders of the defined types were placed in this encounter.     Return for f/u 6-9 months, RD OD, DFE, OCT.  There are no Patient Instructions on file for this visit.  Explained the diagnoses, plan, and follow up with the patient and they expressed understanding.  Patient expressed understanding of the importance of proper follow up care.   This document serves as a record of services personally performed by Gardiner Sleeper, MD, PhD. It was created on their behalf by Roselee Nova, COMT. The creation of this record is the provider's dictation and/or activities during the visit.  Electronically signed by: Roselee Nova, COMT 11/15/20 2:49 PM   This document serves as a record of services personally performed by Gardiner Sleeper, MD, PhD. It was created on their behalf by San Jetty. Owens Shark, OA an ophthalmic technician. The creation of this record is the provider's dictation and/or activities during the visit.     Electronically signed by: San Jetty. Owens Shark, [place] 03.09.2022 2:49 PM  Gardiner Sleeper, M.D., Ph.D. Diseases & Surgery of the Retina and Vitreous [facility]  I have reviewed the above documentation for accuracy and completeness, and I agree with the above. Gardiner Sleeper, M.D., Ph.D. 11/15/20 2:49 PM   Abbreviations: M myopia (nearsighted); A astigmatism; H hyperopia (farsighted); P presbyopia; Mrx spectacle prescription;  CTL contact lenses; OD right eye; OS left eye; OU both eyes  XT exotropia; ET esotropia; PEK punctate epithelial keratitis; PEE punctate epithelial erosions; DES dry eye syndrome; MGD meibomian gland dysfunction; ATs artificial tears; PFAT's preservative free artificial tears; [place] nuclear sclerotic cataract; PSC posterior subcapsular cataract; ERM epi-retinal membrane; PVD posterior vitreous detachment; RD retinal detachment; DM diabetes mellitus; DR diabetic retinopathy; NPDR non-proliferative diabetic retinopathy; PDR proliferative diabetic retinopathy; CSME clinically significant macular edema; DME diabetic macular edema; dbh dot blot hemorrhages; CWS cotton wool spot; POAG primary open angle glaucoma; C/D cup-to-disc ratio; HVF humphrey visual field; GVF goldmann visual field; OCT optical coherence tomography; IOP  intraocular pressure; BRVO Branch retinal vein occlusion; CRVO central retinal vein occlusion; CRAO central retinal artery occlusion; BRAO branch retinal artery occlusion; RT retinal tear; SB scleral buckle; PPV pars plana vitrectomy; VH Vitreous hemorrhage; PRP panretinal laser photocoagulation; IVK intravitreal kenalog; VMT vitreomacular traction; MH Macular hole;  NVD neovascularization of the disc; NVE neovascularization elsewhere; AREDS age related eye disease study; ARMD age related macular degeneration; POAG primary open angle glaucoma; EBMD epithelial/anterior basement membrane dystrophy; ACIOL anterior chamber intraocular lens; IOL  intraocular lens; PCIOL posterior chamber intraocular lens; Phaco/IOL phacoemulsification with intraocular lens placement; [place] photorefractive keratectomy; LASIK laser assisted in situ keratomileusis; HTN hypertension; DM diabetes mellitus; COPD chronic obstructive pulmonary disease

## 2020-11-15 ENCOUNTER — Encounter (INDEPENDENT_AMBULATORY_CARE_PROVIDER_SITE_OTHER): Payer: Self-pay | Admitting: Ophthalmology

## 2020-11-15 ENCOUNTER — Ambulatory Visit (INDEPENDENT_AMBULATORY_CARE_PROVIDER_SITE_OTHER): Payer: Medicare PPO | Admitting: Ophthalmology

## 2020-11-15 ENCOUNTER — Other Ambulatory Visit: Payer: Self-pay

## 2020-11-15 DIAGNOSIS — H35033 Hypertensive retinopathy, bilateral: Secondary | ICD-10-CM

## 2020-11-15 DIAGNOSIS — I10 Essential (primary) hypertension: Secondary | ICD-10-CM

## 2020-11-15 DIAGNOSIS — H3581 Retinal edema: Secondary | ICD-10-CM | POA: Diagnosis not present

## 2020-11-15 DIAGNOSIS — H35371 Puckering of macula, right eye: Secondary | ICD-10-CM | POA: Diagnosis not present

## 2020-11-15 DIAGNOSIS — H3321 Serous retinal detachment, right eye: Secondary | ICD-10-CM

## 2020-11-15 DIAGNOSIS — Z961 Presence of intraocular lens: Secondary | ICD-10-CM | POA: Diagnosis not present

## 2020-11-20 DIAGNOSIS — E1129 Type 2 diabetes mellitus with other diabetic kidney complication: Secondary | ICD-10-CM | POA: Diagnosis not present

## 2020-11-20 DIAGNOSIS — E1142 Type 2 diabetes mellitus with diabetic polyneuropathy: Secondary | ICD-10-CM | POA: Diagnosis not present

## 2020-11-20 DIAGNOSIS — Z6841 Body Mass Index (BMI) 40.0 and over, adult: Secondary | ICD-10-CM | POA: Diagnosis not present

## 2020-11-20 DIAGNOSIS — R809 Proteinuria, unspecified: Secondary | ICD-10-CM | POA: Diagnosis not present

## 2020-11-20 DIAGNOSIS — E782 Mixed hyperlipidemia: Secondary | ICD-10-CM | POA: Diagnosis not present

## 2020-11-20 DIAGNOSIS — E890 Postprocedural hypothyroidism: Secondary | ICD-10-CM | POA: Diagnosis not present

## 2020-11-20 DIAGNOSIS — I10 Essential (primary) hypertension: Secondary | ICD-10-CM | POA: Diagnosis not present

## 2020-11-23 DIAGNOSIS — G4734 Idiopathic sleep related nonobstructive alveolar hypoventilation: Secondary | ICD-10-CM | POA: Diagnosis not present

## 2020-11-23 DIAGNOSIS — R059 Cough, unspecified: Secondary | ICD-10-CM | POA: Diagnosis not present

## 2020-11-23 DIAGNOSIS — Z6841 Body Mass Index (BMI) 40.0 and over, adult: Secondary | ICD-10-CM | POA: Diagnosis not present

## 2020-11-23 DIAGNOSIS — R053 Chronic cough: Secondary | ICD-10-CM | POA: Diagnosis not present

## 2020-11-23 DIAGNOSIS — R0602 Shortness of breath: Secondary | ICD-10-CM | POA: Diagnosis not present

## 2020-11-23 DIAGNOSIS — J310 Chronic rhinitis: Secondary | ICD-10-CM | POA: Diagnosis not present

## 2020-11-23 DIAGNOSIS — E663 Overweight: Secondary | ICD-10-CM | POA: Diagnosis not present

## 2020-11-26 DIAGNOSIS — G4733 Obstructive sleep apnea (adult) (pediatric): Secondary | ICD-10-CM | POA: Diagnosis not present

## 2020-11-28 DIAGNOSIS — H18413 Arcus senilis, bilateral: Secondary | ICD-10-CM | POA: Diagnosis not present

## 2020-11-28 DIAGNOSIS — Z961 Presence of intraocular lens: Secondary | ICD-10-CM | POA: Diagnosis not present

## 2020-11-28 DIAGNOSIS — H04123 Dry eye syndrome of bilateral lacrimal glands: Secondary | ICD-10-CM | POA: Diagnosis not present

## 2020-11-28 DIAGNOSIS — H26491 Other secondary cataract, right eye: Secondary | ICD-10-CM | POA: Diagnosis not present

## 2020-11-28 DIAGNOSIS — H35371 Puckering of macula, right eye: Secondary | ICD-10-CM | POA: Diagnosis not present

## 2020-12-06 DIAGNOSIS — I890 Lymphedema, not elsewhere classified: Secondary | ICD-10-CM | POA: Diagnosis not present

## 2020-12-13 DIAGNOSIS — Z1231 Encounter for screening mammogram for malignant neoplasm of breast: Secondary | ICD-10-CM | POA: Diagnosis not present

## 2020-12-14 DIAGNOSIS — J310 Chronic rhinitis: Secondary | ICD-10-CM | POA: Diagnosis not present

## 2020-12-14 DIAGNOSIS — G4733 Obstructive sleep apnea (adult) (pediatric): Secondary | ICD-10-CM | POA: Diagnosis not present

## 2020-12-14 DIAGNOSIS — R0600 Dyspnea, unspecified: Secondary | ICD-10-CM | POA: Diagnosis not present

## 2021-01-11 DIAGNOSIS — I10 Essential (primary) hypertension: Secondary | ICD-10-CM | POA: Diagnosis not present

## 2021-01-11 DIAGNOSIS — E782 Mixed hyperlipidemia: Secondary | ICD-10-CM | POA: Diagnosis not present

## 2021-01-11 DIAGNOSIS — G4733 Obstructive sleep apnea (adult) (pediatric): Secondary | ICD-10-CM | POA: Diagnosis not present

## 2021-01-11 DIAGNOSIS — R0602 Shortness of breath: Secondary | ICD-10-CM | POA: Diagnosis not present

## 2021-01-11 DIAGNOSIS — Z6841 Body Mass Index (BMI) 40.0 and over, adult: Secondary | ICD-10-CM | POA: Diagnosis not present

## 2021-01-11 DIAGNOSIS — E1129 Type 2 diabetes mellitus with other diabetic kidney complication: Secondary | ICD-10-CM | POA: Diagnosis not present

## 2021-01-11 DIAGNOSIS — R809 Proteinuria, unspecified: Secondary | ICD-10-CM | POA: Diagnosis not present

## 2021-01-24 DIAGNOSIS — U071 COVID-19: Secondary | ICD-10-CM | POA: Diagnosis not present

## 2021-02-15 DIAGNOSIS — R809 Proteinuria, unspecified: Secondary | ICD-10-CM | POA: Diagnosis not present

## 2021-02-15 DIAGNOSIS — E782 Mixed hyperlipidemia: Secondary | ICD-10-CM | POA: Diagnosis not present

## 2021-02-15 DIAGNOSIS — E1129 Type 2 diabetes mellitus with other diabetic kidney complication: Secondary | ICD-10-CM | POA: Diagnosis not present

## 2021-02-15 DIAGNOSIS — I10 Essential (primary) hypertension: Secondary | ICD-10-CM | POA: Diagnosis not present

## 2021-02-20 DIAGNOSIS — E782 Mixed hyperlipidemia: Secondary | ICD-10-CM | POA: Diagnosis not present

## 2021-02-20 DIAGNOSIS — D519 Vitamin B12 deficiency anemia, unspecified: Secondary | ICD-10-CM | POA: Diagnosis not present

## 2021-02-20 DIAGNOSIS — Z8616 Personal history of COVID-19: Secondary | ICD-10-CM | POA: Diagnosis not present

## 2021-02-20 DIAGNOSIS — Z6841 Body Mass Index (BMI) 40.0 and over, adult: Secondary | ICD-10-CM | POA: Diagnosis not present

## 2021-02-20 DIAGNOSIS — E1129 Type 2 diabetes mellitus with other diabetic kidney complication: Secondary | ICD-10-CM | POA: Diagnosis not present

## 2021-02-20 DIAGNOSIS — Z0000 Encounter for general adult medical examination without abnormal findings: Secondary | ICD-10-CM | POA: Diagnosis not present

## 2021-02-20 DIAGNOSIS — I10 Essential (primary) hypertension: Secondary | ICD-10-CM | POA: Diagnosis not present

## 2021-03-05 DIAGNOSIS — E782 Mixed hyperlipidemia: Secondary | ICD-10-CM | POA: Diagnosis not present

## 2021-03-05 DIAGNOSIS — E1129 Type 2 diabetes mellitus with other diabetic kidney complication: Secondary | ICD-10-CM | POA: Diagnosis not present

## 2021-03-05 DIAGNOSIS — R809 Proteinuria, unspecified: Secondary | ICD-10-CM | POA: Diagnosis not present

## 2021-03-05 DIAGNOSIS — E890 Postprocedural hypothyroidism: Secondary | ICD-10-CM | POA: Diagnosis not present

## 2021-03-05 DIAGNOSIS — E1142 Type 2 diabetes mellitus with diabetic polyneuropathy: Secondary | ICD-10-CM | POA: Diagnosis not present

## 2021-03-05 DIAGNOSIS — I10 Essential (primary) hypertension: Secondary | ICD-10-CM | POA: Diagnosis not present

## 2021-04-02 DIAGNOSIS — D519 Vitamin B12 deficiency anemia, unspecified: Secondary | ICD-10-CM | POA: Diagnosis not present

## 2021-04-05 DIAGNOSIS — J310 Chronic rhinitis: Secondary | ICD-10-CM | POA: Diagnosis not present

## 2021-04-05 DIAGNOSIS — G4733 Obstructive sleep apnea (adult) (pediatric): Secondary | ICD-10-CM | POA: Diagnosis not present

## 2021-04-05 DIAGNOSIS — R0609 Other forms of dyspnea: Secondary | ICD-10-CM | POA: Diagnosis not present

## 2021-04-22 NOTE — Progress Notes (Signed)
MRN : [id number]  Mariah Cortez is a 70 y.o. (1951-06-14) female who presents with chief complaint of leg swelling.  History of Present Illness:   The patient returns to the office for followup evaluation regarding leg swelling.  The swelling has persisted but with the lymph pump is much, much better controlled. The pain associated with swelling is essentially eliminated. There have not been any interval development of a ulcerations or wounds.  The patient denies problems with the pump, noting it is working well and the leggings are in good condition.  Since the previous visit the patient has been wearing graduated compression stockings and using the lymph pump on a routine basis and  has noted significant improvement in the lymphedema.   Patient stated the lymph pump has been a very positive factor in her care.    No outpatient medications have been marked as taking for the 04/23/21 encounter (Appointment) with Gilda Crease, Latina Craver, MD.   Current Facility-Administered Medications for the 04/23/21 encounter (Appointment) with Gilda Crease, Latina Craver, MD  Medication   gatifloxacin (ZYMAXID) 0.5 % ophthalmic drops 1 drop    Past Medical History:  Diagnosis Date   Allergies    Arthritis    Diabetes mellitus    GERD (gastroesophageal reflux disease)    Headache    Hyperlipidemia    Hypertension    Hypertensive retinopathy    OU   Retinal detachment    right eye   Stroke [facility])    " mild"   Wears glasses     Past Surgical History:  Procedure Laterality Date   CATARACT EXTRACTION Bilateral    Dr. Vonna Kotyk   COLONOSCOPY     EYE SURGERY Right 11/25/2019   SBP+PPV for RD repair - Dr. Rennis Chris   EYE SURGERY Bilateral    Cat Sx - Dr. Vonna Kotyk   GAS/FLUID EXCHANGE Right 11/25/2019   Procedure: Gas/Fluid Exchange;  Surgeon: Rennis Chris, MD;  Location: [facility] OR;  Service: Ophthalmology;  Laterality: Right;   JOINT REPLACEMENT     B/L   LIPOMA RESECTION     Dr. Michela Pitcher   PHOTOCOAGULATION  Right 11/25/2019   Procedure: Photocoagulation;  Surgeon: Rennis Chris, MD;  Location: [facility] OR;  Service: Ophthalmology;  Laterality: Right;   REPLACEMENT TOTAL KNEE  2010   Right, Dr. Ernest Pine   REPLACEMENT TOTAL KNEE  2013   left   RETINAL DETACHMENT SURGERY Right 11/25/2019   SBP+PPV - Dr. Rennis Chris   VITRECTOMY 25 GAUGE WITH SCLERAL BUCKLE Right 11/25/2019   Procedure: VITRECTOMY 25 GAUGE WITH SCLERAL BUCKLE;  Surgeon: Rennis Chris, MD;  Location: [facility] OR;  Service: Ophthalmology;  Laterality: Right;    Social History Social History   Tobacco Use   Smoking status: Never   Smokeless tobacco: Never  Vaping Use   Vaping Use: Never used  Substance Use Topics   Alcohol use: Yes    Comment: occassionaly   Drug use: Never    Family History Family History  Problem Relation Age of Onset   Stroke Mother    Hypertension Mother    Diabetes Sister    Arthritis Maternal Aunt     Allergies  Allergen Reactions   Latex Rash    Latex tape   Morphine And Related Itching     REVIEW OF SYSTEMS (Negative unless checked)  Constitutional: [] Weight loss  [] Fever  [] Chills Cardiac: [] Chest pain   [] Chest pressure   [] Palpitations   [] Shortness of breath when laying flat   []   Shortness of breath with exertion. Vascular:  [] Pain in legs with walking   [] Pain in legs at rest  [] History of DVT   [] Phlebitis   [x] Swelling in legs   [] Varicose veins   [] Non-healing ulcers Pulmonary:   [] Uses home oxygen   [] Productive cough   [] Hemoptysis   [] Wheeze  [] COPD   [] Asthma Neurologic:  [] Dizziness   [] Seizures   [] History of stroke   [] History of TIA  [] Aphasia   [] Vissual changes   [] Weakness or numbness in arm   [] Weakness or numbness in leg Musculoskeletal:   [] Joint swelling   [] Joint pain   [] Low back pain Hematologic:  [] Easy bruising  [] Easy bleeding   [] Hypercoagulable state   [] Anemic Gastrointestinal:  [] Diarrhea   [] Vomiting  [] Gastroesophageal reflux/heartburn   [] Difficulty  swallowing. Genitourinary:  [] Chronic kidney disease   [] Difficult urination  [] Frequent urination   [] Blood in urine Skin:  [] Rashes   [] Ulcers  Psychological:  [] History of anxiety   []  History of major depression.  Physical Examination  There were no vitals filed for this visit. There is no height or weight on file to calculate BMI. Gen: WD/WN, NAD Head: [place]/AT, No temporalis wasting.  Ear/Nose/Throat: Hearing grossly intact, nares w/o erythema or drainage, pinna without lesions Eyes: PER, EOMI, sclera nonicteric.  Neck: Supple, no gross masses.  No JVD.  Pulmonary:  Good air movement, no audible wheezing, no use of accessory muscles.  Cardiac: RRR, precordium not hyperdynamic. Vascular:  scattered varicosities present bilaterally.  Mild venous stasis changes to the legs bilaterally.  2+ soft pitting edema  Vessel Right Left  Radial Palpable Palpable  Gastrointestinal: soft, non-distended. No guarding/no peritoneal signs.  Musculoskeletal: M/S 5/5 throughout.  No deformity.  Neurologic: CN 2-12 intact. Pain and light touch intact in extremities.  Symmetrical.  Speech is fluent. Motor exam as listed above. Psychiatric: Judgment intact, Mood & affect appropriate for pt's clinical situation. Dermatologic: Venous rashes no ulcers noted.  No changes consistent with cellulitis. Lymph : No lichenification or skin changes of chronic lymphedema.  CBC Lab Results  Component Value Date   WBC 6.6 11/25/2019   HGB 12.2 11/25/2019   HCT 38.1 11/25/2019   MCV 96.5 11/25/2019   PLT 180 11/25/2019    BMET    Component Value Date/Time   NA 140 11/25/2019 1026   NA 141 01/08/2012 0402   K 4.1 11/25/2019 1026   K 3.7 01/08/2012 0402   CL 103 11/25/2019 1026   CL 103 01/08/2012 0402   CO2 26 11/25/2019 1026   CO2 31 01/08/2012 0402   GLUCOSE 155 (H) 11/25/2019 1026   GLUCOSE 165 (H) 01/08/2012 0402   BUN 22 11/25/2019 1026   BUN 7 01/08/2012 0402   CREATININE 0.92 11/25/2019 1026    CREATININE 0.57 (L) 01/08/2012 0402   CALCIUM 10.1 11/25/2019 1026   CALCIUM 9.1 01/08/2012 0402   GFRNONAA >60 11/25/2019 1026   GFRNONAA >60 01/08/2012 0402   GFRAA >60 11/25/2019 1026   GFRAA >60 01/08/2012 0402   CrCl cannot be calculated (Patient's most recent lab result is older than the maximum 21 days allowed.).  COAG Lab Results  Component Value Date   INR 0.9 12/24/2011    Radiology No results found.   Assessment/Plan 1. Lymphedema  No surgery or intervention at this point in time.    I have reviewed my discussion with the patient regarding lymphedema and why it  causes symptoms.  Patient will continue wearing graduated compression stockings  class 1 (20-30 mmHg) on a daily basis a prescription was given. The patient is reminded to put the stockings on first thing in the morning and removing them in the evening. The patient is instructed specifically not to sleep in the stockings.   In addition, behavioral modification throughout the day will be continued.  This will include frequent elevation (such as in a recliner), use of over the counter pain medications as needed and exercise such as walking.  I have reviewed systemic causes for chronic edema such as liver, kidney and cardiac etiologies and there does not appear to be any significant changes in these organ systems over the past year.  The patient is under the impression that these organ systems are all stable and unchanged.    The patient will continue aggressive use of the  lymph pump.  This will continue to improve the edema control and prevent sequela such as ulcers and infections.   The patient will follow-up with me on an annual basis.    2. Chronic venous insufficiency  No surgery or intervention at this point in time.    I have reviewed my discussion with the patient regarding lymphedema and why it  causes symptoms.  Patient will continue wearing graduated compression stockings class 1 (20-30 mmHg) on a daily  basis a prescription was given. The patient is reminded to put the stockings on first thing in the morning and removing them in the evening. The patient is instructed specifically not to sleep in the stockings.   In addition, behavioral modification throughout the day will be continued.  This will include frequent elevation (such as in a recliner), use of over the counter pain medications as needed and exercise such as walking.  I have reviewed systemic causes for chronic edema such as liver, kidney and cardiac etiologies and there does not appear to be any significant changes in these organ systems over the past year.  The patient is under the impression that these organ systems are all stable and unchanged.    The patient will continue aggressive use of the  lymph pump.  This will continue to improve the edema control and prevent sequela such as ulcers and infections.   The patient will follow-up with me on an annual basis.    3. Primary hypertension Continue antihypertensive medications as already ordered, these medications have been reviewed and there are no changes at this time.   4. Controlled type 2 diabetes mellitus without complication, unspecified whether long term insulin use (HCC) Continue hypoglycemic medications as already ordered, these medications have been reviewed and there are no changes at this time.  Hgb A1C to be monitored as already arranged by primary service   5. Primary osteoarthritis involving multiple joints Continue NSAID medications as already ordered, these medications have been reviewed and there are no changes at this time.  Continued activity and therapy was stressed.     Levora Dredge, MD  04/22/2021 3:37 PM

## 2021-04-23 ENCOUNTER — Ambulatory Visit (INDEPENDENT_AMBULATORY_CARE_PROVIDER_SITE_OTHER): Payer: Medicare HMO | Admitting: Vascular Surgery

## 2021-04-23 ENCOUNTER — Other Ambulatory Visit: Payer: Self-pay

## 2021-04-23 ENCOUNTER — Encounter (INDEPENDENT_AMBULATORY_CARE_PROVIDER_SITE_OTHER): Payer: Self-pay | Admitting: Vascular Surgery

## 2021-04-23 VITALS — BP 144/62 | HR 90 | Resp 16 | Wt 260.0 lb

## 2021-04-23 DIAGNOSIS — M159 Polyosteoarthritis, unspecified: Secondary | ICD-10-CM

## 2021-04-23 DIAGNOSIS — M8949 Other hypertrophic osteoarthropathy, multiple sites: Secondary | ICD-10-CM | POA: Diagnosis not present

## 2021-04-23 DIAGNOSIS — I872 Venous insufficiency (chronic) (peripheral): Secondary | ICD-10-CM | POA: Diagnosis not present

## 2021-04-23 DIAGNOSIS — I890 Lymphedema, not elsewhere classified: Secondary | ICD-10-CM

## 2021-04-23 DIAGNOSIS — E119 Type 2 diabetes mellitus without complications: Secondary | ICD-10-CM | POA: Diagnosis not present

## 2021-04-23 DIAGNOSIS — I10 Essential (primary) hypertension: Secondary | ICD-10-CM

## 2021-04-29 ENCOUNTER — Encounter (INDEPENDENT_AMBULATORY_CARE_PROVIDER_SITE_OTHER): Payer: Self-pay | Admitting: Vascular Surgery

## 2021-05-03 DIAGNOSIS — D519 Vitamin B12 deficiency anemia, unspecified: Secondary | ICD-10-CM | POA: Diagnosis not present

## 2021-05-29 ENCOUNTER — Encounter (INDEPENDENT_AMBULATORY_CARE_PROVIDER_SITE_OTHER): Payer: Medicare HMO | Admitting: Ophthalmology

## 2021-06-01 DIAGNOSIS — E1129 Type 2 diabetes mellitus with other diabetic kidney complication: Secondary | ICD-10-CM | POA: Diagnosis not present

## 2021-06-01 DIAGNOSIS — Z6841 Body Mass Index (BMI) 40.0 and over, adult: Secondary | ICD-10-CM | POA: Diagnosis not present

## 2021-06-01 DIAGNOSIS — R809 Proteinuria, unspecified: Secondary | ICD-10-CM | POA: Diagnosis not present

## 2021-06-01 DIAGNOSIS — E782 Mixed hyperlipidemia: Secondary | ICD-10-CM | POA: Diagnosis not present

## 2021-06-01 DIAGNOSIS — I10 Essential (primary) hypertension: Secondary | ICD-10-CM | POA: Diagnosis not present

## 2021-06-04 DIAGNOSIS — E538 Deficiency of other specified B group vitamins: Secondary | ICD-10-CM | POA: Diagnosis not present

## 2021-06-04 DIAGNOSIS — D519 Vitamin B12 deficiency anemia, unspecified: Secondary | ICD-10-CM | POA: Diagnosis not present

## 2021-06-14 DIAGNOSIS — E538 Deficiency of other specified B group vitamins: Secondary | ICD-10-CM | POA: Diagnosis not present

## 2021-07-05 DIAGNOSIS — D519 Vitamin B12 deficiency anemia, unspecified: Secondary | ICD-10-CM | POA: Diagnosis not present

## 2021-07-06 NOTE — Progress Notes (Signed)
[facility] Note  07/10/2021     CHIEF COMPLAINT Patient presents for Retina Follow Up   HISTORY OF PRESENT ILLNESS: Mariah Cortez is a 70 y.o. female who presents to the clinic today for:   HPI     Retina Follow Up   Patient presents with  Retinal Break/Detachment.  In right eye.  Severity is moderate.  Duration of 7.5 months.  Since onset it is stable.  I, the attending physician,  performed the HPI with the patient and updated documentation appropriately.        Comments   Patient states vision the same OU. Has itching and watering OU--uses AT's.       Last edited by Mariah Caffey, MD on 07/13/2021  1:24 AM.     Patient states her glasses are not correct.  She see better out of her reading glasses better than with her glasses on.  She has progressive lenses.  Eyes are tearing and itchy.  Taking Benadryl.  Will try Zyrtec.    Referring physician: Anell Cortez, Mariah Cortez,  Mariah Cortez  HISTORICAL INFORMATION:   Selected notes from the MEDICAL RECORD NUMBER Referred by Mariah Cortez for concern of mac off RD   CURRENT MEDICATIONS: Current Outpatient Medications (Ophthalmic Drugs)  Medication Sig   bacitracin-polymyxin b (POLYSPORIN) ophthalmic ointment Place into the right eye at bedtime. Place a 1/2 inch ribbon of ointment into the lower eyelid. (Patient not taking: No sig reported)   gatifloxacin (ZYMAXID) 0.5 % SOLN INSTILL ONE DROP INTO THE RIGHT EYE FOUR TIMES DAILY (Patient not taking: Reported on 07/10/2021)   Current Facility-Administered Medications (Ophthalmic Drugs)  Medication Route   gatifloxacin (ZYMAXID) 0.5 % ophthalmic drops 1 drop Right Eye   Current Outpatient Medications (Other)  Medication Sig   ACCU-CHEK GUIDE test strip    APPLE CIDER VINEGAR PO Take 450 mg by mouth daily.   Ascorbic Acid (VITAMIN C CR) 1000 MG TBCR Take 1,000 mg by mouth daily.   aspirin 81 MG tablet Take 81 mg by mouth  daily.   Cholecalciferol (VITAMIN D3) 50 MCG (2000 UT) TABS Take 2,000 mg by mouth daily.   diphenhydrAMINE (BENADRYL) 25 MG tablet Take 25 mg by mouth daily.   Ferrous Sulfate (IRON) 325 (65 FE) MG TABS Take 325 mg by mouth in the morning and at bedtime.   glimepiride (AMARYL) 4 MG tablet Take 4 mg by mouth 2 (two) times daily.   hydrochlorothiazide (HYDRODIURIL) 25 MG tablet Take 25 mg by mouth daily.   losartan (COZAAR) 100 MG tablet Take by mouth.   lovastatin (MEVACOR) 40 MG tablet Take 40 mg by mouth 2 (two) times daily.   meloxicam (MOBIC) 7.5 MG tablet Take 7.5 mg by mouth daily.   metFORMIN (GLUCOPHAGE) 1000 MG tablet Take 1,000 mg by mouth in the morning and at bedtime.    Misc Natural Products (OSTEO BI-FLEX JOINT SHIELD PO) Take 1 tablet by mouth in the morning and at bedtime.   Multiple Vitamin (MULTIVITAMIN) capsule Take 1 capsule by mouth daily.   pantoprazole (PROTONIX) 40 MG tablet Take 40 mg by mouth daily.   pioglitazone (ACTOS) 30 MG tablet Take 30 mg by mouth daily.   Potassium 99 MG TABS Take 99 mg by mouth daily.   traMADol (ULTRAM) 50 MG tablet Take 50 mg by mouth 2 (two) times daily. Additional  50 mg if needed   vitamin B-12 (CYANOCOBALAMIN)  1000 MCG tablet Take 2,500 mcg by mouth daily.   amLODipine (NORVASC) 5 MG tablet  (Patient not taking: No sig reported)   cetirizine (ZYRTEC) 10 MG tablet Take 10 mg by mouth daily. (Patient not taking: No sig reported)   esomeprazole (NEXIUM) 20 MG capsule Take 20 mg by mouth daily as needed (Heartburn). (Patient not taking: No sig reported)   furosemide (LASIX) 40 MG tablet Take by mouth.   valACYclovir (VALTREX) 1000 MG tablet Take 1 tablet (1,000 mg total) by mouth 3 (three) times daily. (Patient not taking: Reported on 07/10/2021)   No current facility-administered medications for this visit. (Other)   REVIEW OF SYSTEMS: ROS   Positive for: Gastrointestinal, Neurological, Endocrine, Eyes Negative for: Constitutional,  Skin, Genitourinary, Musculoskeletal, HENT, Cardiovascular, Respiratory, Psychiatric, Allergic/Imm, Heme/Lymph Last edited by Mariah Cortez, COT on 07/10/2021  2:10 PM.      ALLERGIES Allergies  Allergen Reactions   Latex Rash    Latex tape   Morphine And Related Itching   PAST MEDICAL HISTORY Past Medical History:  Diagnosis Date   Allergies    Arthritis    Diabetes mellitus    GERD (gastroesophageal reflux disease)    Headache    Hyperlipidemia    Hypertension    Hypertensive retinopathy    OU   Retinal detachment    right eye   Stroke Mariah Cortez)    " mild"   Wears glasses    Past Surgical History:  Procedure Laterality Date   CATARACT EXTRACTION Bilateral    Dr. Talbert Cortez   COLONOSCOPY     EYE SURGERY Right 11/25/2019   SBP+PPV for RD repair - Dr. Bernarda Cortez   EYE SURGERY Bilateral    Cat Sx - Dr. Talbert Cortez   GAS/FLUID EXCHANGE Right 11/25/2019   Procedure: Gas/Fluid Exchange;  Surgeon: Mariah Caffey, MD;  Location: [facility];  Service: Ophthalmology;  Laterality: Right;   JOINT REPLACEMENT     B/L   LIPOMA RESECTION     Dr. Pat Cortez   PHOTOCOAGULATION Right 11/25/2019   Procedure: Photocoagulation;  Surgeon: Mariah Caffey, MD;  Location: [facility];  Service: Ophthalmology;  Laterality: Right;   REPLACEMENT TOTAL KNEE  2010   Right, Dr. Marry Cortez   REPLACEMENT TOTAL KNEE  2013   left   RETINAL DETACHMENT SURGERY Right 11/25/2019   SBP+PPV - Dr. Bernarda Cortez   VITRECTOMY [place] BUCKLE Right 11/25/2019   Procedure: VITRECTOMY 25 GAUGE WITH SCLERAL BUCKLE;  Surgeon: Mariah Caffey, MD;  Location: [facility];  Service: Ophthalmology;  Laterality: Right;   FAMILY HISTORY Family History  Problem Relation Age of Onset   Stroke Mother    Hypertension Mother    Diabetes Sister    Arthritis Maternal Aunt    SOCIAL HISTORY Social History   Tobacco Use   Smoking status: Never   Smokeless tobacco: Never  Vaping Use   Vaping Use: Never used  Substance Use Topics   Alcohol  use: Yes    Comment: occassionaly   Drug use: Never         OPHTHALMIC EXAM:  Base Eye Exam     Visual Acuity (Snellen - Linear)       Right Left   Dist cc 20/25 -2 20/20   Dist ph cc NI     Correction: Glasses         Tonometry (Tonopen, 2:26 PM)       Right Left   Pressure 15 13  Pupils       Dark Light Shape React APD   Right 3 2 Round Slow None   Left 3 2 Round Slow None         Visual Fields (Counting fingers)       Left Right    Full Full         Extraocular Movement       Right Left    Full, Ortho Full, Ortho         Neuro/Psych     Oriented x3: Yes   Mood/Affect: Normal         Dilation     Both eyes: 1.0% Mydriacyl, 2.5% Phenylephrine @ 2:26 PM           Slit Lamp and Fundus Exam     Slit Lamp Exam       Right Left   Lids/Lashes Dermatochalasis - upper lid, Meibomian gland dysfunction Dermatochalasis - upper lid, Meibomian gland dysfunction   Conjunctiva/Sclera White and quiet White and quiet   Cornea 2-3+Punctate epithelial erosions, well healed temporal cataract wounds, decreased TBUT, irregular epith 1-2+ focal PEE, irregular epith inferonasal quad, decreased TBUT, well healed temporal cataract wounds   Anterior Chamber Deep and quiet Deep and quiet   Iris Round and dilated, irregular, focal Posterior synechiae at 0400 Round and dilated, No NVI   Lens PC IOL in good position, open PC PC IOL in good position   Vitreous post vitrectomy, trace pigment, mild vitreous debris Vitreous syneresis, Posterior vitreous detachment         Fundus Exam       Right Left   Disc mild Pallor, Sharp rim mild Pallor, Sharp rim   C/Cortez Ratio 0.2 0.3   Macula Flat/reattached, blunted foveal reflex, RPE mottling, focal punctate PED superior to fovea, no heme, + ERM -- slightly increased Flat, Blunted foveal reflex, Retinal pigment epithelial mottling, No heme or edema   Vessels attenuated, Tortuous attenuated, Tortuous    Periphery retina attached over buckle, good buckle height, good laser over buckle 360; mild ERM and fibrosis inferiorly; ORIGINALLY: Inferior detachment from 0200-1030, Subretinal band IT midzone, ?small tear at 1030 Attached, 2 focal areas of pigment clumping temporally, No RT/RD on 360 exam           Refraction     Wearing Rx       Sphere Cylinder Axis   Right -2.50 +0.75 113   Left -0.75 +0.75 165            IMAGING AND PROCEDURES  Imaging and Procedures for _0 @  OCT, Retina - OU - Both Eyes       Right Eye Quality was good. Central Foveal Thickness: 307. Progression has worsened. Findings include no SRF, outer retinal atrophy, epiretinal membrane, no IRF, pigment epithelial detachment, abnormal foveal contour, retinal drusen (Mild progression of ERM with blunting of foveal contour. ).   Left Eye Quality was good. Central Foveal Thickness: 256. Progression has been stable. Findings include normal foveal contour, no IRF, no SRF.   Notes *Images captured and stored on drive  Diagnosis / Impression:  OD: Mild progression of ERM with blunting of foveal contour.  OS: NFP, no IRF/SRF  Clinical management:  See below  Abbreviations: NFP - Normal foveal profile. CME - cystoid macular edema. PED - pigment epithelial detachment. IRF - intraretinal fluid. SRF - subretinal fluid. EZ - ellipsoid zone. ERM - epiretinal membrane. ORA - outer retinal atrophy. ORT - outer retinal tubulation.  SRHM - subretinal hyper-reflective material               ASSESSMENT/PLAN:    ICD-10-CM   1. Right retinal detachment  H33.21     2. Retinal edema  H35.81 OCT, Retina - OU - Both Eyes    3. Epiretinal membrane (ERM) of right eye  H35.371     4. Essential hypertension  I10     5. Hypertensive retinopathy of both eyes  H35.033     6. Pseudophakia of both eyes  Z96.1         1,2. 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 Epiretinal membrane, right eye   - no metamorphopsia - BCVA 20/25 OD  - OCT shows Mild progression of ERM with blunting of foveal contour.   - no indication for surgery at this time   - recommend monitoring for now  - f/u 6 mos          4,5. Hypertensive retinopathy OU             - discussed importance of tight BP control             - monitor   6. Pseudophakia OU w/ PCO OD  - s/p CE/IOL OU (Dr. Talbert Cortez, OD: 07.19.21, OS: 08.09.21)  - IOLs in good position, doing well  - s/p YAG cap OD 10.27.21 -- PC nicely open  - f/u 6 months   Ophthalmic Meds Ordered this visit:  No orders of the defined types were placed in this encounter.     Return in 6 months (on 01/07/2022) for ERM OD, h/o RD OD -- DFE, OCT.  There are no Patient Instructions on file for this visit.  Explained the diagnoses, plan, and follow up with the patient and they expressed understanding.  Patient expressed understanding of the importance of proper follow up care.   This document serves as a record of services personally performed by Gardiner Sleeper, MD, PhD. It was created on their behalf by Estill Bakes, COT an ophthalmic technician. The creation of this record is the provider's dictation and/or activities during the visit.    Electronically signed by: Estill Bakes, COT 10.28.22 @ 1:30 AM  This document serves as a record of services personally performed by Gardiner Sleeper, MD, PhD. It was created on their behalf by Leonie Douglas, an ophthalmic technician. The creation of this record is the provider's dictation and/or activities during the visit.    Electronically signed by: Shirlean Mylar  Nyra Capes COA, 07/13/21  1:30 AM   Gardiner Sleeper, M.Cortez., Ph.Cortez. Diseases & Surgery of the Retina and Vitreous [facility] 07/10/2021  I have reviewed the above documentation for accuracy and completeness, and I agree with the above. Gardiner Sleeper, M.Cortez., Ph.Cortez. 07/13/21 1:32 AM   Abbreviations: M myopia (nearsighted); A astigmatism; H hyperopia (farsighted); P presbyopia; Mrx spectacle prescription;  CTL contact lenses; OD right eye; OS left eye; OU both eyes  XT exotropia; ET esotropia; PEK punctate epithelial keratitis; PEE punctate epithelial erosions; DES dry eye syndrome; MGD meibomian gland dysfunction; ATs artificial tears; PFAT's preservative free artificial tears; [place] nuclear sclerotic cataract; PSC posterior subcapsular cataract; ERM epi-retinal membrane; PVD posterior vitreous detachment; RD retinal detachment; DM diabetes mellitus; DR diabetic retinopathy; NPDR non-proliferative diabetic retinopathy; PDR proliferative diabetic retinopathy; CSME clinically significant macular edema; DME diabetic macular edema; dbh dot blot hemorrhages; CWS cotton wool spot; POAG primary open angle glaucoma; C/Cortez cup-to-disc ratio; HVF humphrey visual field; GVF goldmann visual field; OCT optical coherence tomography; IOP intraocular pressure; BRVO Branch retinal vein occlusion; CRVO central retinal vein occlusion; CRAO central retinal artery occlusion; BRAO branch retinal artery occlusion; RT retinal tear; SB scleral buckle; PPV pars plana vitrectomy; VH Vitreous hemorrhage; PRP panretinal laser photocoagulation; IVK intravitreal kenalog; VMT vitreomacular traction; MH Macular hole;  NVD neovascularization of the disc; NVE neovascularization elsewhere; AREDS age related eye disease study; ARMD age related macular degeneration; POAG  primary open angle glaucoma; EBMD epithelial/anterior basement membrane dystrophy; ACIOL anterior chamber intraocular lens; IOL intraocular lens; PCIOL posterior chamber intraocular lens; Phaco/IOL phacoemulsification with intraocular lens placement; [place] photorefractive keratectomy; LASIK laser assisted in situ keratomileusis; HTN hypertension; DM diabetes mellitus; COPD chronic obstructive pulmonary disease

## 2021-07-10 ENCOUNTER — Other Ambulatory Visit: Payer: Self-pay

## 2021-07-10 ENCOUNTER — Encounter (INDEPENDENT_AMBULATORY_CARE_PROVIDER_SITE_OTHER): Payer: Self-pay | Admitting: Ophthalmology

## 2021-07-10 ENCOUNTER — Ambulatory Visit (INDEPENDENT_AMBULATORY_CARE_PROVIDER_SITE_OTHER): Payer: Medicare HMO | Admitting: Ophthalmology

## 2021-07-10 DIAGNOSIS — Z961 Presence of intraocular lens: Secondary | ICD-10-CM | POA: Diagnosis not present

## 2021-07-10 DIAGNOSIS — H3321 Serous retinal detachment, right eye: Secondary | ICD-10-CM

## 2021-07-10 DIAGNOSIS — H3581 Retinal edema: Secondary | ICD-10-CM

## 2021-07-10 DIAGNOSIS — H35371 Puckering of macula, right eye: Secondary | ICD-10-CM | POA: Diagnosis not present

## 2021-07-10 DIAGNOSIS — I10 Essential (primary) hypertension: Secondary | ICD-10-CM

## 2021-07-10 DIAGNOSIS — H35033 Hypertensive retinopathy, bilateral: Secondary | ICD-10-CM | POA: Diagnosis not present

## 2021-07-12 DIAGNOSIS — R0602 Shortness of breath: Secondary | ICD-10-CM | POA: Diagnosis not present

## 2021-07-12 DIAGNOSIS — Z6841 Body Mass Index (BMI) 40.0 and over, adult: Secondary | ICD-10-CM | POA: Diagnosis not present

## 2021-07-12 DIAGNOSIS — E1129 Type 2 diabetes mellitus with other diabetic kidney complication: Secondary | ICD-10-CM | POA: Diagnosis not present

## 2021-07-12 DIAGNOSIS — G4733 Obstructive sleep apnea (adult) (pediatric): Secondary | ICD-10-CM | POA: Diagnosis not present

## 2021-07-12 DIAGNOSIS — E782 Mixed hyperlipidemia: Secondary | ICD-10-CM | POA: Diagnosis not present

## 2021-07-12 DIAGNOSIS — R809 Proteinuria, unspecified: Secondary | ICD-10-CM | POA: Diagnosis not present

## 2021-07-12 DIAGNOSIS — I10 Essential (primary) hypertension: Secondary | ICD-10-CM | POA: Diagnosis not present

## 2021-07-13 ENCOUNTER — Encounter (INDEPENDENT_AMBULATORY_CARE_PROVIDER_SITE_OTHER): Payer: Self-pay | Admitting: Ophthalmology

## 2021-07-25 DIAGNOSIS — I10 Essential (primary) hypertension: Secondary | ICD-10-CM | POA: Diagnosis not present

## 2021-07-25 DIAGNOSIS — G4733 Obstructive sleep apnea (adult) (pediatric): Secondary | ICD-10-CM | POA: Diagnosis not present

## 2021-07-25 DIAGNOSIS — J310 Chronic rhinitis: Secondary | ICD-10-CM | POA: Diagnosis not present

## 2021-07-25 DIAGNOSIS — R0602 Shortness of breath: Secondary | ICD-10-CM | POA: Diagnosis not present

## 2021-08-07 DIAGNOSIS — D519 Vitamin B12 deficiency anemia, unspecified: Secondary | ICD-10-CM | POA: Diagnosis not present

## 2021-08-10 DIAGNOSIS — M79674 Pain in right toe(s): Secondary | ICD-10-CM | POA: Diagnosis not present

## 2021-08-10 DIAGNOSIS — E1140 Type 2 diabetes mellitus with diabetic neuropathy, unspecified: Secondary | ICD-10-CM | POA: Diagnosis not present

## 2021-08-10 DIAGNOSIS — S92411A Displaced fracture of proximal phalanx of right great toe, initial encounter for closed fracture: Secondary | ICD-10-CM | POA: Diagnosis not present

## 2021-08-15 DIAGNOSIS — D519 Vitamin B12 deficiency anemia, unspecified: Secondary | ICD-10-CM | POA: Diagnosis not present

## 2021-08-15 DIAGNOSIS — E1129 Type 2 diabetes mellitus with other diabetic kidney complication: Secondary | ICD-10-CM | POA: Diagnosis not present

## 2021-08-15 DIAGNOSIS — E782 Mixed hyperlipidemia: Secondary | ICD-10-CM | POA: Diagnosis not present

## 2021-08-15 DIAGNOSIS — R809 Proteinuria, unspecified: Secondary | ICD-10-CM | POA: Diagnosis not present

## 2021-08-22 DIAGNOSIS — S92411D Displaced fracture of proximal phalanx of right great toe, subsequent encounter for fracture with routine healing: Secondary | ICD-10-CM | POA: Diagnosis not present

## 2021-08-22 DIAGNOSIS — Z6841 Body Mass Index (BMI) 40.0 and over, adult: Secondary | ICD-10-CM | POA: Diagnosis not present

## 2021-08-22 DIAGNOSIS — I10 Essential (primary) hypertension: Secondary | ICD-10-CM | POA: Diagnosis not present

## 2021-08-22 DIAGNOSIS — E1129 Type 2 diabetes mellitus with other diabetic kidney complication: Secondary | ICD-10-CM | POA: Diagnosis not present

## 2021-08-22 DIAGNOSIS — R809 Proteinuria, unspecified: Secondary | ICD-10-CM | POA: Diagnosis not present

## 2021-08-22 DIAGNOSIS — E782 Mixed hyperlipidemia: Secondary | ICD-10-CM | POA: Diagnosis not present

## 2021-08-22 DIAGNOSIS — G4733 Obstructive sleep apnea (adult) (pediatric): Secondary | ICD-10-CM | POA: Diagnosis not present

## 2021-08-29 DIAGNOSIS — E1140 Type 2 diabetes mellitus with diabetic neuropathy, unspecified: Secondary | ICD-10-CM | POA: Diagnosis not present

## 2021-08-29 DIAGNOSIS — S92411D Displaced fracture of proximal phalanx of right great toe, subsequent encounter for fracture with routine healing: Secondary | ICD-10-CM | POA: Diagnosis not present

## 2022-01-03 NOTE — Progress Notes (Addendum)
?[facility] Note ? ?01/17/2022 ? ?  ? ?CHIEF COMPLAINT ?Patient presents for Retina Follow Up ? ?HISTORY OF PRESENT ILLNESS: ?Mariah Cortez is a 71 y.o. female who presents to the clinic today for:  ? ?HPI   ? ? Retina Follow Up   ?Patient presents with  Other.  In right eye.  Duration of 6 months.  Since onset it is stable.  I, the attending physician,  performed the HPI with the patient and updated documentation appropriately. ? ?  ?  ? ? Comments   ?6 month follow up RD/ERM OD-  Patient got new glasses about a year ago.  They have never been right.  She hasn't been able to read fine print with them on.  She will get her reading glasses and see fine.   ?Sometimes it seems like a film over part of the eye OD.  Using Refresh BID OU.  ?BS 139 this morning, A1C 7 ? ?  ?  ?Last edited by [name], MD on 01/17/2022 10:14 PM.  ?  ?Pt states her eyes "aren't doing good", she feels like her right eye has a film over it, she is using AT's BID ? ? ?Referring physician: ?Anell Barr, OD ?[address] ?[place],  [place] [address] ? ?HISTORICAL INFORMATION:  ? ?Selected notes from the [place] ?Referred by Dr. Orion Modest for concern of mac off RD  ? ?CURRENT MEDICATIONS: ?Current Outpatient Medications (Ophthalmic Drugs)  ?Medication Sig  ? bacitracin-polymyxin b (POLYSPORIN) ophthalmic ointment Place into the right eye at bedtime. Place a 1/2 inch ribbon of ointment into the lower eyelid. (Patient not taking: Reported on 04/23/2021)  ? gatifloxacin (ZYMAXID) 0.5 % SOLN INSTILL ONE DROP INTO THE RIGHT EYE FOUR TIMES DAILY (Patient not taking: Reported on 07/10/2021)  ? ?Current Facility-Administered Medications (Ophthalmic Drugs)  ?Medication Route  ? gatifloxacin (ZYMAXID) 0.5 % ophthalmic drops 1 drop Right Eye  ? ?Current Outpatient Medications (Other)  ?Medication Sig  ? APPLE CIDER VINEGAR PO Take 450 mg by mouth daily.  ? Ascorbic Acid (VITAMIN C CR) 1000 MG TBCR Take 1,000 mg  by mouth daily.  ? aspirin 81 MG tablet Take 81 mg by mouth daily.  ? Cholecalciferol (VITAMIN D3) 50 MCG (2000 UT) TABS Take 2,000 mg by mouth daily.  ? Ferrous Sulfate (IRON) 325 (65 FE) MG TABS Take 325 mg by mouth in the morning and at bedtime.  ? glimepiride (AMARYL) 4 MG tablet Take 4 mg by mouth 2 (two) times daily.  ? hydrochlorothiazide (HYDRODIURIL) 25 MG tablet Take 25 mg by mouth daily.  ? lovastatin (MEVACOR) 40 MG tablet Take 40 mg by mouth 2 (two) times daily.  ? meloxicam (MOBIC) 7.5 MG tablet Take 7.5 mg by mouth daily.  ? metFORMIN (GLUCOPHAGE) 1000 MG tablet Take 1,000 mg by mouth in the morning and at bedtime.   ? Misc Natural Products (OSTEO BI-FLEX JOINT SHIELD PO) Take 1 tablet by mouth in the morning and at bedtime.  ? Multiple Vitamin (MULTIVITAMIN) capsule Take 1 capsule by mouth daily.  ? pantoprazole (PROTONIX) 40 MG tablet Take 40 mg by mouth daily.  ? pioglitazone (ACTOS) 30 MG tablet Take 30 mg by mouth daily.  ? Potassium 99 MG TABS Take 99 mg by mouth daily.  ? traMADol (ULTRAM) 50 MG tablet Take 50 mg by mouth 2 (two) times daily. Additional  50 mg if needed  ? vitamin B-12 (CYANOCOBALAMIN)  1000 MCG tablet Take 2,500 mcg by mouth daily.  ? ACCU-CHEK GUIDE test strip   ? amLODipine (NORVASC) 5 MG tablet  (Patient not taking: Reported on 10/23/2020)  ? cetirizine (ZYRTEC) 10 MG tablet Take 10 mg by mouth daily. (Patient not taking: Reported on 04/23/2021)  ? diphenhydrAMINE (BENADRYL) 25 MG tablet Take 25 mg by mouth daily. (Patient not taking: Reported on 01/17/2022)  ? esomeprazole (NEXIUM) 20 MG capsule Take 20 mg by mouth daily as needed (Heartburn). (Patient not taking: Reported on 04/23/2021)  ? furosemide (LASIX) 40 MG tablet Take by mouth.  ? losartan (COZAAR) 100 MG tablet Take by mouth.  ? valACYclovir (VALTREX) 1000 MG tablet Take 1 tablet (1,000 mg total) by mouth 3 (three) times daily. (Patient not taking: Reported on 07/10/2021)  ? ?No current facility-administered  medications for this visit. (Other)  ? ?REVIEW OF SYSTEMS: ?ROS   ?Positive for: Gastrointestinal, Neurological, Endocrine, Eyes ?Negative for: Constitutional, Skin, Genitourinary, Musculoskeletal, HENT, Cardiovascular, Respiratory, Psychiatric, Allergic/Imm, Heme/Lymph ?Last edited by Leonie Douglas, [place] on 01/17/2022  2:17 PM.  ?  ? ?ALLERGIES ?Allergies  ?Allergen Reactions  ? Latex Rash  ?  Latex tape  ? Morphine And Related Itching  ? ?PAST MEDICAL HISTORY ?Past Medical History:  ?Diagnosis Date  ? Allergies   ? Arthritis   ? Diabetes mellitus   ? GERD (gastroesophageal reflux disease)   ? Headache   ? Hyperlipidemia   ? Hypertension   ? Hypertensive retinopathy   ? OU  ? Retinal detachment   ? right eye  ? Stroke [facility])   ? " mild"  ? Wears glasses   ? ?Past Surgical History:  ?Procedure Laterality Date  ? CATARACT EXTRACTION Bilateral   ? Dr. Talbert Forest  ? COLONOSCOPY    ? EYE SURGERY Right 11/25/2019  ? SBP+PPV for RD repair - Dr. [name]  ? EYE SURGERY Bilateral   ? Cat Sx - Dr. Talbert Forest  ? GAS/FLUID EXCHANGE Right 11/25/2019  ? Procedure: Gas/Fluid Exchange;  Surgeon: [name], MD;  Location: [facility];  Service: Ophthalmology;  Laterality: Right;  ? JOINT REPLACEMENT    ? B/L  ? LIPOMA RESECTION    ? Dr. [name]  ? PHOTOCOAGULATION Right 11/25/2019  ? Procedure: Photocoagulation;  Surgeon: [name], MD;  Location: [place];  Service: Ophthalmology;  Laterality: Right;  ? REPLACEMENT TOTAL KNEE  2010  ? Right, Dr. [name]  ? REPLACEMENT TOTAL KNEE  2013  ? left  ? RETINAL DETACHMENT SURGERY Right 11/25/2019  ? SBP+PPV - Dr. [name]  ? VITRECTOMY 25 GAUGE WITH SCLERAL BUCKLE Right 11/25/2019  ? Procedure: VITRECTOMY 25 GAUGE WITH SCLERAL BUCKLE;  Surgeon: [name], MD;  Location: [place];  Service: Ophthalmology;  Laterality: Right;  ? ?FAMILY HISTORY ?Family History  ?Problem Relation Age of Onset  ? Stroke Mother   ? Hypertension Mother   ? Diabetes Sister   ? Arthritis Maternal Aunt   ? ?SOCIAL  HISTORY ?Social History  ? ?Tobacco Use  ? Smoking status: Never  ? Smokeless tobacco: Never  ?Vaping Use  ? Vaping Use: Never used  ?Substance Use Topics  ? Alcohol use: Yes  ?  Comment: occassionaly  ? Drug use: Never  ?  ? ?  ?OPHTHALMIC EXAM: ? ?Base Eye Exam   ? ? Visual Acuity (Snellen - Linear)   ? ?   Right Left  ? Dist cc 20/20 -2 20/25 +2  ? Dist ph cc  20/20  ? ?  Correction: Glasses  ? ?  ?  ? ? Tonometry (Tonopen, 2:26 PM)   ? ?   Right Left  ? Pressure 20 17  ? ?  ?  ? ? Pupils   ? ?   Dark Light Shape React APD  ? Right 3 2 Round Slow None  ? Left 3 2 Round Slow None  ? ?  ?  ? ? Visual Fields (Counting fingers)   ? ?   Left Right  ?  Full Full  ? ?  ?  ? ? Extraocular Movement   ? ?   Right Left  ?  Full Full  ? ?  ?  ? ? Neuro/Psych   ? ? Oriented x3: Yes  ? Mood/Affect: Normal  ? ?  ?  ? ? Dilation   ? ? Both eyes: 1.0% Mydriacyl, 2.5% Phenylephrine @ 2:26 PM  ? ?  ?  ? ?  ? ?Slit Lamp and Fundus Exam   ? ? Slit Lamp Exam   ? ?   Right Left  ? Lids/Lashes Dermatochalasis - upper lid, Meibomian gland dysfunction Dermatochalasis - upper lid, Meibomian gland dysfunction  ? Conjunctiva/Sclera White and quiet White and quiet  ? Cornea 2+ inferior Punctate epithelial erosions, well healed temporal cataract wounds trace PEE, decreased TBUT, well healed temporal cataract wounds  ? Anterior Chamber Deep and quiet Deep and quiet  ? Iris Round and dilated, irregular, focal Posterior synechiae at 0400 Round and dilated, No NVI  ? Lens PC IOL in good position, open PC PC IOL in good position  ? Anterior Vitreous post vitrectomy, trace pigment, mild vitreous debris Vitreous syneresis, Posterior vitreous detachment  ? ?  ?  ? ? Fundus Exam   ? ?   Right Left  ? Disc mild Pallor, Sharp rim mild Pallor, Sharp rim  ? C/D Ratio 0.2 0.3  ? Macula Flat/reattached, good foveal reflex, RPE mottling, focal punctate PED superior to fovea, no heme, +ERM Flat, good foveal reflex, Retinal pigment epithelial mottling, No heme  or edema  ? Vessels attenuated, Tortuous attenuated, Tortuous  ? Periphery retina attached over buckle, good buckle height, good laser over buckle 360; mild ERM and fibrosis inferiorly; ORIGINALLY: Inferior detachment from 0

## 2022-01-08 ENCOUNTER — Encounter (INDEPENDENT_AMBULATORY_CARE_PROVIDER_SITE_OTHER): Payer: Medicare HMO | Admitting: Ophthalmology

## 2022-01-17 ENCOUNTER — Encounter (INDEPENDENT_AMBULATORY_CARE_PROVIDER_SITE_OTHER): Payer: Self-pay | Admitting: Ophthalmology

## 2022-01-17 ENCOUNTER — Ambulatory Visit (INDEPENDENT_AMBULATORY_CARE_PROVIDER_SITE_OTHER): Payer: Medicare PPO | Admitting: Ophthalmology

## 2022-01-17 DIAGNOSIS — H3321 Serous retinal detachment, right eye: Secondary | ICD-10-CM

## 2022-01-17 DIAGNOSIS — H35033 Hypertensive retinopathy, bilateral: Secondary | ICD-10-CM | POA: Diagnosis not present

## 2022-01-17 DIAGNOSIS — I10 Essential (primary) hypertension: Secondary | ICD-10-CM | POA: Diagnosis not present

## 2022-01-17 DIAGNOSIS — H35371 Puckering of macula, right eye: Secondary | ICD-10-CM | POA: Diagnosis not present

## 2022-01-17 DIAGNOSIS — Z961 Presence of intraocular lens: Secondary | ICD-10-CM

## 2022-01-17 DIAGNOSIS — H04123 Dry eye syndrome of bilateral lacrimal glands: Secondary | ICD-10-CM

## 2022-01-17 DIAGNOSIS — H26491 Other secondary cataract, right eye: Secondary | ICD-10-CM

## 2022-01-17 DIAGNOSIS — H25813 Combined forms of age-related cataract, bilateral: Secondary | ICD-10-CM

## 2022-04-19 NOTE — Progress Notes (Signed)
MRN : [id number]  Mariah Cortez is a 71 y.o. (30-Jun-1951) female who presents with chief complaint of legs swell.  History of Present Illness:   The patient returns to the office for followup evaluation regarding leg swelling.  The swelling has improved quite a bit and the pain associated with swelling has decreased substantially. There have not been any interval development of a ulcerations or wounds.  Since the previous visit the patient has been wearing graduated compression stockings and has noted some improvement in the lymphedema. The patient has been using compression routinely morning until night.  The patient also states elevation during the day and exercise (such as walking) is being done too.    No outpatient medications have been marked as taking for the 04/22/22 encounter (Appointment) with Gilda Crease, Latina Craver, MD.   Current Facility-Administered Medications for the 04/22/22 encounter (Appointment) with Gilda Crease, Latina Craver, MD  Medication   gatifloxacin (ZYMAXID) 0.5 % ophthalmic drops 1 drop    Past Medical History:  Diagnosis Date   Allergies    Arthritis    Diabetes mellitus    GERD (gastroesophageal reflux disease)    Headache    Hyperlipidemia    Hypertension    Hypertensive retinopathy    OU   Retinal detachment    right eye   Stroke [facility])    " mild"   Wears glasses     Past Surgical History:  Procedure Laterality Date   CATARACT EXTRACTION Bilateral    Dr. Vonna Kotyk   COLONOSCOPY     EYE SURGERY Right 11/25/2019   SBP+PPV for RD repair - Dr. Rennis Chris   EYE SURGERY Bilateral    Cat Sx - Dr. Vonna Kotyk   GAS/FLUID EXCHANGE Right 11/25/2019   Procedure: Gas/Fluid Exchange;  Surgeon: Rennis Chris, MD;  Location: [facility] OR;  Service: Ophthalmology;  Laterality: Right;   JOINT REPLACEMENT     B/L   LIPOMA RESECTION     Dr. Michela Pitcher   PHOTOCOAGULATION Right 11/25/2019   Procedure: Photocoagulation;  Surgeon: Rennis Chris, MD;  Location: [facility] OR;  Service:  Ophthalmology;  Laterality: Right;   REPLACEMENT TOTAL KNEE  2010   Right, Dr. Ernest Pine   REPLACEMENT TOTAL KNEE  2013   left   RETINAL DETACHMENT SURGERY Right 11/25/2019   SBP+PPV - Dr. Rennis Chris   VITRECTOMY 25 GAUGE WITH SCLERAL BUCKLE Right 11/25/2019   Procedure: VITRECTOMY 25 GAUGE WITH SCLERAL BUCKLE;  Surgeon: Rennis Chris, MD;  Location: [facility];  Service: Ophthalmology;  Laterality: Right;    Social History Social History   Tobacco Use   Smoking status: Never   Smokeless tobacco: Never  Vaping Use   Vaping Use: Never used  Substance Use Topics   Alcohol use: Yes    Comment: occassionaly   Drug use: Never    Family History Family History  Problem Relation Age of Onset   Stroke Mother    Hypertension Mother    Diabetes Sister    Arthritis Maternal Aunt     Allergies  Allergen Reactions   Latex Rash    Latex tape   Morphine And Related Itching     REVIEW OF SYSTEMS (Negative unless checked)  Constitutional: [] Weight loss  [] Fever  [] Chills Cardiac: [] Chest pain   [] Chest pressure   [] Palpitations   [] Shortness of breath when laying flat   [] Shortness of breath with exertion. Vascular:  [] Pain in legs with walking   [x] Pain in legs with standing  [] History  of DVT   [] Phlebitis   [x] Swelling in legs   [] Varicose veins   [] Non-healing ulcers Pulmonary:   [] Uses home oxygen   [] Productive cough   [] Hemoptysis   [] Wheeze  [] COPD   [] Asthma Neurologic:  [] Dizziness   [] Seizures   [] History of stroke   [] History of TIA  [] Aphasia   [] Vissual changes   [] Weakness or numbness in arm   [] Weakness or numbness in leg Musculoskeletal:   [] Joint swelling   [] Joint pain   [] Low back pain Hematologic:  [] Easy bruising  [] Easy bleeding   [] Hypercoagulable state   [] Anemic Gastrointestinal:  [] Diarrhea   [] Vomiting  [] Gastroesophageal reflux/heartburn   [] Difficulty swallowing. Genitourinary:  [] Chronic kidney disease   [] Difficult urination  [] Frequent urination   [] Blood in  urine Skin:  [] Rashes   [] Ulcers  Psychological:  [] History of anxiety   []  History of major depression.  Physical Examination  There were no vitals filed for this visit. There is no height or weight on file to calculate BMI. Gen: WD/WN, NAD Head: [place]/AT, No temporalis wasting.  Ear/Nose/Throat: Hearing grossly intact, nares w/o erythema or drainage, pinna without lesions Eyes: PER, EOMI, sclera nonicteric.  Neck: Supple, no gross masses.  No JVD.  Pulmonary:  Good air movement, no audible wheezing, no use of accessory muscles.  Cardiac: RRR, precordium not hyperdynamic. Vascular:  scattered varicosities present bilaterally.  Mild venous stasis changes to the legs bilaterally.  2+ soft pitting edema  Vessel Right Left  Radial Palpable Palpable  Gastrointestinal: soft, non-distended. No guarding/no peritoneal signs.  Musculoskeletal: M/S 5/5 throughout.  No deformity.  Neurologic: CN 2-12 intact. Pain and light touch intact in extremities.  Symmetrical.  Speech is fluent. Motor exam as listed above. Psychiatric: Judgment intact, Mood & affect appropriate for pt's clinical situation. Dermatologic: Venous rashes no ulcers noted.  No changes consistent with cellulitis. Lymph : No lichenification or skin changes of chronic lymphedema.  CBC Lab Results  Component Value Date   WBC 6.6 11/25/2019   HGB 12.2 11/25/2019   HCT 38.1 11/25/2019   MCV 96.5 11/25/2019   PLT 180 11/25/2019    BMET    Component Value Date/Time   NA 140 11/25/2019 1026   NA 141 01/08/2012 0402   K 4.1 11/25/2019 1026   K 3.7 01/08/2012 0402   CL 103 11/25/2019 1026   CL 103 01/08/2012 0402   CO2 26 11/25/2019 1026   CO2 31 01/08/2012 0402   GLUCOSE 155 (H) 11/25/2019 1026   GLUCOSE 165 (H) 01/08/2012 0402   BUN 22 11/25/2019 1026   BUN 7 01/08/2012 0402   CREATININE 0.92 11/25/2019 1026   CREATININE 0.57 (L) 01/08/2012 0402   CALCIUM 10.1 11/25/2019 1026   CALCIUM 9.1 01/08/2012 0402   GFRNONAA >60  11/25/2019 1026   GFRNONAA >60 01/08/2012 0402   GFRAA >60 11/25/2019 1026   GFRAA >60 01/08/2012 0402   CrCl cannot be calculated (Patient's most recent lab result is older than the maximum 21 days allowed.).  COAG Lab Results  Component Value Date   INR 0.9 12/24/2011    Radiology No results found.   Assessment/Plan 1. Lymphedema Recommend:  No surgery or intervention at this point in time.    I have reviewed my previous discussion with the patient regarding swelling and why it  causes symptoms.  The patient is doing well with compression and will continue wearing graduated compression on a daily basis. The patient will  continue wearing the compression first  thing in the morning and removing them in the evening. The patient is instructed specifically not to sleep in the compression.    In addition, behavioral modification including elevation during the day and exercise as tolerated will be continued.    Patient should follow-up on an annual basis    2. Pure hypercholesterolemia Continue statin as ordered and reviewed, no changes at this time   3. Controlled type 2 diabetes mellitus without complication, unspecified whether long term insulin use ([place]) Continue hypoglycemic medications as already ordered, these medications have been reviewed and there are no changes at this time.  Hgb A1C to be monitored as already arranged by primary service   4. Primary hypertension Continue antihypertensive medications as already ordered, these medications have been reviewed and there are no changes at this time.     Hortencia Pilar, MD  04/19/2022 2:55 PM

## 2022-04-22 ENCOUNTER — Ambulatory Visit (INDEPENDENT_AMBULATORY_CARE_PROVIDER_SITE_OTHER): Payer: Medicare PPO | Admitting: Vascular Surgery

## 2022-04-22 ENCOUNTER — Encounter (INDEPENDENT_AMBULATORY_CARE_PROVIDER_SITE_OTHER): Payer: Self-pay | Admitting: Vascular Surgery

## 2022-04-22 VITALS — BP 145/70 | HR 101 | Resp 16 | Wt 271.6 lb

## 2022-04-22 DIAGNOSIS — E119 Type 2 diabetes mellitus without complications: Secondary | ICD-10-CM | POA: Diagnosis not present

## 2022-04-22 DIAGNOSIS — I890 Lymphedema, not elsewhere classified: Secondary | ICD-10-CM | POA: Diagnosis not present

## 2022-04-22 DIAGNOSIS — I10 Essential (primary) hypertension: Secondary | ICD-10-CM

## 2022-04-22 DIAGNOSIS — E7800 Pure hypercholesterolemia, unspecified: Secondary | ICD-10-CM

## 2022-04-28 ENCOUNTER — Encounter (INDEPENDENT_AMBULATORY_CARE_PROVIDER_SITE_OTHER): Payer: Self-pay | Admitting: Vascular Surgery

## 2022-10-10 DEATH — deceased

## 2023-01-15 ENCOUNTER — Encounter (INDEPENDENT_AMBULATORY_CARE_PROVIDER_SITE_OTHER): Payer: Medicare PPO | Admitting: Ophthalmology

## 2023-04-23 ENCOUNTER — Ambulatory Visit (INDEPENDENT_AMBULATORY_CARE_PROVIDER_SITE_OTHER): Payer: Medicare PPO | Admitting: Nurse Practitioner
